# Patient Record
Sex: Male | Born: 1980 | Race: Black or African American | Hispanic: No | Marital: Single | Smoking: Never smoker
Health system: Southern US, Community
[De-identification: ages and names within clinical notes are randomized; demographics above are authoritative.]

## PROBLEM LIST (undated history)

## (undated) HISTORY — PX: FRACTURE SURGERY: SHX138

---

## 2016-04-26 ENCOUNTER — Ambulatory Visit (INDEPENDENT_AMBULATORY_CARE_PROVIDER_SITE_OTHER): Payer: Worker's Compensation

## 2016-04-26 ENCOUNTER — Encounter (INDEPENDENT_AMBULATORY_CARE_PROVIDER_SITE_OTHER): Payer: Self-pay

## 2016-04-26 ENCOUNTER — Ambulatory Visit (INDEPENDENT_AMBULATORY_CARE_PROVIDER_SITE_OTHER): Payer: Worker's Compensation | Admitting: Sports Medicine

## 2016-04-26 ENCOUNTER — Encounter: Payer: Self-pay | Admitting: Sports Medicine

## 2016-04-26 DIAGNOSIS — M2040 Other hammer toe(s) (acquired), unspecified foot: Secondary | ICD-10-CM

## 2016-04-26 DIAGNOSIS — M2141 Flat foot [pes planus] (acquired), right foot: Secondary | ICD-10-CM

## 2016-04-26 DIAGNOSIS — M2142 Flat foot [pes planus] (acquired), left foot: Secondary | ICD-10-CM

## 2016-04-26 DIAGNOSIS — S93602A Unspecified sprain of left foot, initial encounter: Secondary | ICD-10-CM

## 2016-04-26 DIAGNOSIS — M779 Enthesopathy, unspecified: Secondary | ICD-10-CM

## 2016-04-26 DIAGNOSIS — M79672 Pain in left foot: Secondary | ICD-10-CM

## 2016-04-26 DIAGNOSIS — M21619 Bunion of unspecified foot: Secondary | ICD-10-CM

## 2016-04-26 MED ORDER — TRIAMCINOLONE ACETONIDE 40 MG/ML IJ SUSP: 20.0000 mg | Freq: Once | INTRAMUSCULAR | Status: DC

## 2016-04-26 MED ORDER — ACETAMINOPHEN-CODEINE #3 300-30 MG PO TABS: 1.0000 | ORAL_TABLET | Freq: Four times a day (QID) | ORAL | 0 refills | Status: DC | PRN

## 2016-04-26 MED ORDER — METHYLPREDNISOLONE 4 MG PO TBPK: ORAL_TABLET | ORAL | 0 refills | Status: DC

## 2016-04-26 NOTE — Progress Notes (Signed)
Subjective: Allen Davis is a 35 y.o. male patient who presents to office for evaluation of Left big toe joint pain. Patient reports work-related injury 02/14/2016 of which he tripped over a torn safety mat and hit his foot on machine bolt jamming the big toe. Reports that on date of injury, He was permitted to rest and advised to follow up with supervisor if pain persists. Since injury, Patient was treated at urgent care where x-rays were performed and he was diagnosed with turf toe. Patient states that he was given a postoperative shoe, Motrin and other anti-inflammatories and tramadol for pain. States that he still gets swelling across the joint and pain especially when he tries to wear a different shoe other than the postop shoe. States that the pain varies right now pain is 0 out of 10. However, after work can become more bothersome. States that the pain is sharp and radiates on the side of the big toe. 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 Patient denies any other pedal complaints.   There are no active problems to display for this patient.   No current outpatient prescriptions on file prior to visit.   No current facility-administered medications on file prior to visit.     Allergies not on file  Objective:  General: Alert and oriented x3 in no acute distress  Dermatology: No open lesions bilateral lower extremities, no webspace macerations, no ecchymosis bilateral, all nails x 10 are Short and thick but well manicured.  Vascular: Dorsalis Pedis and Posterior Tibial pedal pulses 2/4, Capillary Fill Time 3 seconds, (+) pedal hair growth bilateral, no edema bilateral lower extremities, Temperature gradient within normal limits.  Neurology: Michaell Cowing sensation intact via light touch bilateral. (-) Tinels sign bilateral.   Musculoskeletal: Mild tenderness with palpation at first metatarsal head medial  eminence on left with hallux deviation/deformity, mild guarding without limitation or crepitus with range of motion at left first metatarsophalangeal joint, bunion deformity reducible, tracking not trackbound, Midtarsal, Subtalar joint, and ankle joint range of motion is within normal limits. There is medial arch collapse bilateral suggestive of pes planus with mild lesser hammertoe. Strength within normal limits bilateral.  Xrays  Left Foot    Impression: Normal osseous mineralization, there is mild increased Intermetatarsal angle above normal limits with prominent medial eminence at first metatarsal head and hallux deviation consistent with hallux interphalangeus, lesser hammertoe, pes planus, no fracture or dislocation, soft tissue margins within normal limits. No foreign body.      Assessment and Plan: Problem List Items Addressed This Visit    None    Visit Diagnoses    Left foot pain    -  Primary   Relevant Medications   acetaminophen-codeine (TYLENOL #3) 300-30 MG tablet   methylPREDNISolone (MEDROL DOSEPAK) 4 MG TBPK tablet   triamcinolone acetonide (KENALOG-40) injection 20 mg   Other Relevant Orders   DG Foot 2 Views Left   Capsulitis       Relevant Medications   ibuprofen (ADVIL,MOTRIN) 800 MG tablet   meloxicam (MOBIC) 15 MG tablet   traMADol (ULTRAM) 50 MG tablet   acetaminophen-codeine (TYLENOL #3) 300-30 MG tablet   methylPREDNISolone (MEDROL DOSEPAK) 4 MG TBPK tablet   triamcinolone acetonide (KENALOG-40) injection 20 mg   Bunion       Sprain of foot, left, initial encounter       Relevant Medications   acetaminophen-codeine (TYLENOL #3) 300-30 MG  tablet   methylPREDNISolone (MEDROL DOSEPAK) 4 MG TBPK tablet   Pes planus of both feet       Hammertoe, unspecified laterality          -Complete examination performed -Xrays reviewed -Discussed treatement options For likely preexistent bunion with pain exacerbated by work-related injury and possible sprain of the  first metatarsophalangeal joint, left foot. -After verbal consent, injected into left 1st MTPJ for symptomatic relief 1cc lidocaine plain, 1cc marcaine plain, 0.5cc dexamethasone and Kenalog 40 without complication.   -Recommend icing daily as instructed -Rx Medrol dose pack to take as instructed and Tylenol 3 to take as needed for pain -Recommend continue with postoperative shoe for protection to the big toe joint until symptoms are improved -Recommend continue with light duty with no excessive walking or standing beyond 30 minutes at a time and no heavy lifting over 15 pounds. Failure to comply with this recommendation, May lead to further injury. -Patient to return to office in 3-4 weeks for follow-up evaluation pending approval for follow-up visit from work comp, or sooner if condition worsens.  Asencion Islam, DPM

## 2016-04-30 ENCOUNTER — Telehealth: Payer: Self-pay | Admitting: *Deleted

## 2016-04-30 NOTE — Telephone Encounter (Addendum)
-----   Message from Warner Mccreedyheryl M Bedington sent at 04/30/2016 10:45 AM EDT ----- Regarding: Med Adjustment Contact: [phone] Patient says that the Tylenol #3 that Dr. Marylene LandStover prescribed at his last visit is making him very sleepy at work.  He is asking for an adjustment or another med.

## 2016-04-30 NOTE — Telephone Encounter (Signed)
He can try taking 1/2 the pill if that does not work we can change the medication to tramadol -Dr. Marylene LandStover

## 2016-04-30 NOTE — Telephone Encounter (Signed)
I informed pt of Dr. Wynema BirchStover's orders to try 1/2 the Tylenol #3, and if it didn't work to call again. Pt states understanding.

## 2016-05-24 ENCOUNTER — Ambulatory Visit (INDEPENDENT_AMBULATORY_CARE_PROVIDER_SITE_OTHER): Payer: Worker's Compensation | Admitting: Sports Medicine

## 2016-05-24 ENCOUNTER — Encounter: Payer: Self-pay | Admitting: Sports Medicine

## 2016-05-24 DIAGNOSIS — M21619 Bunion of unspecified foot: Secondary | ICD-10-CM

## 2016-05-24 DIAGNOSIS — M79672 Pain in left foot: Secondary | ICD-10-CM

## 2016-05-24 DIAGNOSIS — M2142 Flat foot [pes planus] (acquired), left foot: Secondary | ICD-10-CM

## 2016-05-24 DIAGNOSIS — S93602D Unspecified sprain of left foot, subsequent encounter: Secondary | ICD-10-CM

## 2016-05-24 DIAGNOSIS — M2040 Other hammer toe(s) (acquired), unspecified foot: Secondary | ICD-10-CM | POA: Diagnosis not present

## 2016-05-24 DIAGNOSIS — M2141 Flat foot [pes planus] (acquired), right foot: Secondary | ICD-10-CM | POA: Diagnosis not present

## 2016-05-24 DIAGNOSIS — M779 Enthesopathy, unspecified: Secondary | ICD-10-CM | POA: Diagnosis not present

## 2016-05-24 NOTE — Progress Notes (Signed)
Subjective: Allen Davis is a 35 y.o. male patient who returns to office for follow up evaluation of Left big toe joint pain. Patient had a work-related injury 02/14/2016 of which he tripped over a torn safety mat and hit his foot on machine bolt jamming the big toe. Reports that injection last visit helped and medrol dose pack and Tylenol #3 helped; has no pain and is tolerating normal shoes. States that the Tylenol #3 made him sleepy so stopped it especially since he felt like he was doing good. Patient denies any other pedal complaints.   There are no active problems to display for this patient.   Current Outpatient Prescriptions on File Prior to Visit  Medication Sig Dispense Refill  . acetaminophen-codeine (TYLENOL #3) 300-30 MG tablet Take 1 tablet by mouth every 6 (six) hours as needed for moderate pain. 30 tablet 0  . ibuprofen (ADVIL,MOTRIN) 800 MG tablet Take 800 mg by mouth every 8 (eight) hours as needed.  0  . meloxicam (MOBIC) 15 MG tablet Take 15 mg by mouth daily. with food  0  . methylPREDNISolone (MEDROL DOSEPAK) 4 MG TBPK tablet Take as instructed 21 tablet 0  . traMADol (ULTRAM) 50 MG tablet TAKE 1 TO 2 TABLETS BY MOUTH AS NEEDED AT BEDTIME  0   Current Facility-Administered Medications on File Prior to Visit  Medication Dose Route Frequency Provider Last Rate Last Dose  . triamcinolone acetonide (KENALOG-40) injection 20 mg  20 mg Other Once Asencion Islamitorya Chandra Asher, DPM        Not on File  Objective:  General: Alert and oriented x3 in no acute distress  Dermatology: No open lesions bilateral lower extremities, no webspace macerations, no ecchymosis bilateral, all nails x 10 are Short and thick but well manicured.  Vascular: Dorsalis Pedis and Posterior Tibial pedal pulses 2/4, Capillary Fill Time 3 seconds, (+) pedal hair growth bilateral, no edema bilateral lower extremities, Temperature gradient within normal limits.  Neurology: Michaell CowingGross sensation intact via light touch  bilateral. (-) Tinels sign bilateral.   Musculoskeletal: No tenderness with palpation at first metatarsal head medial eminence on left, there is hallux deviation/deformity, no guarding without limitation or crepitus with range of motion at left first metatarsophalangeal joint, bunion deformity reducible, tracking not trackbound, Midtarsal, Subtalar joint, and ankle joint range of motion is within normal limits. There is medial arch collapse bilateral suggestive of pes planus with mild lesser hammertoe. Strength within normal limits bilateral.      Assessment and Plan: Problem List Items Addressed This Visit    None    Visit Diagnoses    Sprain of foot, left, subsequent encounter    -  Primary   Capsulitis       Bunion       Hammertoe, unspecified laterality       Pes planus of both feet       Left foot pain          -Complete examination performed -Discussed long term care for bunion with pain exacerbated by work-related injury and possible sprain of the first metatarsophalangeal joint, left foot. -Recommend good supportive shoes and to purchase OTC inserts to prevent recurrence -Recommend icing as needed -Recommend return to full duty with no restrictions -Patient to return to office as needed or sooner if condition worsens.  Asencion Islamitorya Javeria Briski, DPM

## 2017-02-01 ENCOUNTER — Ambulatory Visit: Payer: Worker's Compensation | Admitting: Sports Medicine

## 2017-05-13 ENCOUNTER — Emergency Department (HOSPITAL_COMMUNITY)
Admission: EM | Admit: 2017-05-13 | Discharge: 2017-05-13 | Disposition: A | Discharge status: Home / Self Care | Payer: Self-pay

## 2017-05-13 ENCOUNTER — Encounter (HOSPITAL_COMMUNITY): Payer: Self-pay | Admitting: Emergency Medicine

## 2017-05-13 ENCOUNTER — Ambulatory Visit (HOSPITAL_COMMUNITY)
Admission: EM | Admit: 2017-05-13 | Discharge: 2017-05-13 | Disposition: A | Discharge status: Home / Self Care | Payer: Self-pay | Attending: Emergency Medicine | Admitting: Emergency Medicine

## 2017-05-13 DIAGNOSIS — M79675 Pain in left toe(s): Secondary | ICD-10-CM

## 2017-05-13 MED ORDER — HYDROCODONE-ACETAMINOPHEN 5-325 MG PO TABS: 1.0000 | ORAL_TABLET | Freq: Four times a day (QID) | ORAL | 0 refills | Status: DC | PRN

## 2017-05-13 MED ORDER — PREDNISONE 10 MG (21) PO TBPK: ORAL_TABLET | ORAL | 0 refills | Status: DC

## 2017-05-13 NOTE — ED Triage Notes (Signed)
Pt is having left great toe pain that started yesterday morning.  Pt reports a previous injury to the toe about a year ago.  States he got a cortisone shot that helped it feel better.  Hasn't had any issues until yesterday.

## 2017-05-13 NOTE — ED Provider Notes (Signed)
  [facility]   [id number] 05/13/17 Arrival Time: 1743   SUBJECTIVE:  Allen Davis is a 36 y.o. male who presents to the urgent care  with complaint of left toe pain. Has chronic issues with this foot, was seen by podiatry one year ago and had a steroid injection into the medical tarsal phalangeal joint of the great toe. Over the last 24 hours his pain has worsened. Also has developed redness as well. Denies any history of gout.  ROS: As per HPI, remainder of ROS negative.   OBJECTIVE:  Vitals:   05/13/17 1828  BP: 106/73  Pulse: 74  Temp: 98.6 F (37 C)  TempSrc: Oral  SpO2: 96%     General appearance: alert; no distress HEENT: normocephalic; atraumatic; conjunctivae normal;  Neck: Trachea midline, no JVD Lungs: clear to auscultation bilaterally Heart: regular rate and rhythm Abdomen: soft, non-tender; Musculoskeletal: Notable redness to the base of the great toe, there is tender to touch, capillary refills less than 2 seconds, dorsalis pedis and posterior tibial pulses are 2+ and intact. Skin: warm and dry Neurologic: Grossly normal Psychological:  alert and cooperative; normal mood and affect     ASSESSMENT & PLAN:  1. Pain of toe of left foot     Meds ordered this encounter  Medications  . predniSONE (STERAPRED UNI-PAK 21 TAB) 10 MG (21) TBPK tablet    Sig: Take 6 tablets tomorrow, decrease by 1 each day till finished (6,5,4,3,2,1)    Dispense:  21 tablet    Refill:  0    Order Specific Question:   Supervising Provider    Answer:   Elvina Sidle [[id number]]  . HYDROcodone-acetaminophen (NORCO/VICODIN) 5-325 MG tablet    Sig: Take 1 tablet by mouth every 6 (six) hours as needed.    Dispense:  15 tablet    Refill:  0    Order Specific Question:   Supervising Provider    Answer:   Elvina Sidle [[id number]]   Gout this in the differential based on signs and symptoms, however he states his pain is similar to previous episodes. Has no history of gout.  We'll treat with a short course of hydrocodone and prednisone for inflammation, follow-up with podiatry as needed  [place] controlled substances reporting system consulted prior to issuing prescription, no controlled substances and written in the last year  Reviewed expectations re: course of current medical issues. Questions answered. Outlined signs and symptoms indicating need for more acute intervention. Patient verbalized understanding. After Visit Summary given.    Procedures:     No results found for this or any previous visit.  Labs Reviewed - No data to display  No results found.  No Known Allergies  PMHx, SurgHx, SocialHx, Medications, and Allergies were reviewed in the Visit Navigator and updated as appropriate.       Dorena Bodo, NP 05/13/17 [phone]

## 2017-05-13 NOTE — Discharge Instructions (Signed)
For your toe pain, prescribed a steroid for inflammation called prednisone, take as corrected. I have also prescribed a medicine for pain called hydrocodone, this medicine is a narcotic, it will cause drowsiness, and it is addictive. Do not take more than what is necessary, do not drink alcohol while taking, and do not operate any heavy machinery while taking this medicine. Follow up with podiatry as needed.

## 2017-05-19 ENCOUNTER — Telehealth (HOSPITAL_COMMUNITY): Payer: Self-pay | Admitting: *Deleted

## 2017-05-19 ENCOUNTER — Encounter (HOSPITAL_COMMUNITY): Payer: Self-pay | Admitting: Emergency Medicine

## 2017-05-19 ENCOUNTER — Ambulatory Visit (HOSPITAL_COMMUNITY)
Admission: EM | Admit: 2017-05-19 | Discharge: 2017-05-19 | Disposition: A | Discharge status: Home / Self Care | Payer: Commercial Managed Care - PPO | Attending: Internal Medicine | Admitting: Internal Medicine

## 2017-05-19 DIAGNOSIS — M79675 Pain in left toe(s): Secondary | ICD-10-CM

## 2017-05-19 MED ORDER — PREDNISONE 10 MG (21) PO TBPK: ORAL_TABLET | Freq: Every day | ORAL | 0 refills | Status: DC

## 2017-05-19 MED ORDER — MELOXICAM 7.5 MG PO TABS: 7.5000 mg | ORAL_TABLET | Freq: Every day | ORAL | 0 refills | Status: DC

## 2017-05-19 MED ORDER — MELOXICAM 7.5 MG PO TABS
7.5000 mg | ORAL_TABLET | Freq: Every day | ORAL | 0 refills | Status: DC
Start: 2017-05-19 — End: 2018-06-10

## 2017-05-19 NOTE — Telephone Encounter (Signed)
Pt requesting Rxs sent to [facility] [employer].

## 2017-05-19 NOTE — ED Triage Notes (Signed)
The patient presented to the [facility] with a complaint of left foot pain. The patient was evaluated on 05/13/2017 for the same complaint and referred to ortho but has not contacted them for an appt.

## 2017-05-19 NOTE — Discharge Instructions (Signed)
Start Mobic and prednisone as directed. Ice compress as needed. Follow-up with podiatry for further treatment and management of the left great toe pain.

## 2017-05-19 NOTE — ED Provider Notes (Signed)
[facility]    CSN: [id number] Arrival date & time: 05/19/17  1931     History   Chief Complaint Chief Complaint  Patient presents with  . Foot Pain    HPI Allen Davis is a 36 y.o. male.   36 year old male comes in for exacerbation on chronic left great toe pain. He states this is evaluated a year ago, where he had inflammation to his tendon. He has since had flareups. He was evaluated a week ago for same symptoms, where he was given prednisone pack as well as Vicodin for his pain and inflammation. Patient states he does not feel that prednisone is working, the Vicodin helps with the pain. He states he misplaced his discharge paperwork, and has not been able to contact podiatry for appointment. He states work requires lots of standing and movement, which causes the pain to exacerbate. Denies numbness, tingling, increased swelling, increased redness, increased warmth. Denies fever, chills, night sweats.      History reviewed. No pertinent past medical history.  There are no active problems to display for this patient.   History reviewed. No pertinent surgical history.     Home Medications    Prior to Admission medications   Medication Sig Start Date End Date Taking? Authorizing Provider  acetaminophen-codeine (TYLENOL #3) 300-30 MG tablet Take 1 tablet by mouth every 6 (six) hours as needed for moderate pain. 04/26/16   Asencion Islam, DPM  HYDROcodone-acetaminophen (NORCO/VICODIN) 5-325 MG tablet Take 1 tablet by mouth every 6 (six) hours as needed. 05/13/17   Dorena Bodo, NP  meloxicam (MOBIC) 7.5 MG tablet Take 1 tablet (7.5 mg total) by mouth daily. 05/19/17   Belinda Fisher, PA-C  methylPREDNISolone (MEDROL DOSEPAK) 4 MG TBPK tablet Take as instructed 04/26/16   Asencion Islam, DPM  predniSONE (STERAPRED UNI-PAK 21 TAB) 10 MG (21) TBPK tablet Take by mouth daily. Take 6 tabs by mouth day 1, then 5 tabs, then 4 tabs, then 3 tabs, 2 tabs, then 1 tab for the  last day 05/19/17   Cathie Hoops, [place] V, PA-C  traMADol (ULTRAM) 50 MG tablet TAKE 1 TO 2 TABLETS BY MOUTH AS NEEDED AT BEDTIME 02/24/16   [provider]    Family History History reviewed. No pertinent family history.  Social History Social History  Substance Use Topics  . Smoking status: Never Smoker  . Smokeless tobacco: Never Used  . Alcohol use Not on file     Allergies   Patient has no known allergies.   Review of Systems Review of Systems  Reason unable to perform ROS: See HPI as above.     Physical Exam Triage Vital Signs ED Triage Vitals  Enc Vitals Group     BP 05/19/17 2015 135/83     Pulse Rate 05/19/17 2015 62     Resp 05/19/17 2015 18     Temp 05/19/17 2015 97.9 F (36.6 C)     Temp Source 05/19/17 2015 Oral     SpO2 05/19/17 2015 97 %     Weight --      Height --      Head Circumference --      Peak Flow --      Pain Score 05/19/17 2013 8     Pain Loc --      Pain Edu? --      Excl. in GC? --    No data found.   Updated Vital Signs BP 135/83 (BP Location: Right Arm)  Pulse 62   Temp 97.9 F (36.6 C) (Oral)   Resp 18   SpO2 97%   Visual Acuity Right Eye Distance:   Left Eye Distance:   Bilateral Distance:    Right Eye Near:   Left Eye Near:    Bilateral Near:     Physical Exam  Constitutional: He appears well-developed and well-nourished. No distress.  HENT:  Head: Normocephalic and atraumatic.  Musculoskeletal:  No swelling, erythema, increased warmth noted. Tenderness on palpation of the left great toe at distal metatarsal. Full range of motion of toe. Sensation intact and equal bilaterally. Pedal pulses 2+ and equal bilaterally.     UC Treatments / Results  Labs (all labs ordered are listed, but only abnormal results are displayed) Labs Reviewed - No data to display  EKG  EKG Interpretation None       Radiology No results found.  Procedures Procedures (including critical care time)  Medications Ordered in  UC Medications - No data to display   Initial Impression / Assessment and Plan / UC Course  I have reviewed the triage vital signs and the nursing notes.  Pertinent labs & imaging results that were available during my care of the patient were reviewed by me and considered in my medical decision making (see chart for details).     Meloxicam and prednisone as directed for inflammation and pain. Follow-up with podiatry for further evaluation and treatment. Resources given, return precautions given.  Final Clinical Impressions(s) / UC Diagnoses   Final diagnoses:  Pain of toe of left foot    New Prescriptions New Prescriptions   MELOXICAM (MOBIC) 7.5 MG TABLET    Take 1 tablet (7.5 mg total) by mouth daily.   PREDNISONE (STERAPRED UNI-PAK 21 TAB) 10 MG (21) TBPK TABLET    Take by mouth daily. Take 6 tabs by mouth day 1, then 5 tabs, then 4 tabs, then 3 tabs, 2 tabs, then 1 tab for the last day       Lurline Idol 05/19/17 2118

## 2017-05-23 ENCOUNTER — Telehealth (HOSPITAL_COMMUNITY): Payer: Self-pay | Admitting: Emergency Medicine

## 2017-05-23 NOTE — Telephone Encounter (Signed)
Linward HeadlandAmy Yu, PA called me and clarified that the pt was told that, including Sunday, he was to have a total of three days off and to return to work on August 29, per the work note that was sent home with the pt.  I called the pt back and explained the situation.  Pt stated that he misunderstood and that he would handle it with his job.  Our supervisor does not need to call him back as he states he was satisfied with Amy's answer.

## 2017-05-23 NOTE — Telephone Encounter (Signed)
Pt calling because he thought the provider gave him off of work until Thursday and when he looked at the work note he was sent home with, he saw he was supposed to return yesterday.  Pt is wanting a note to excuse him from work yesterday.

## 2017-06-12 ENCOUNTER — Encounter (HOSPITAL_COMMUNITY): Payer: Self-pay | Admitting: *Deleted

## 2017-06-12 ENCOUNTER — Ambulatory Visit (HOSPITAL_COMMUNITY)
Admission: EM | Admit: 2017-06-12 | Discharge: 2017-06-12 | Disposition: A | Discharge status: Home / Self Care | Payer: Commercial Managed Care - PPO | Attending: Family Medicine | Admitting: Family Medicine

## 2017-06-12 DIAGNOSIS — M109 Gout, unspecified: Secondary | ICD-10-CM | POA: Diagnosis not present

## 2017-06-12 MED ORDER — COLCHICINE 0.6 MG PO TABS: ORAL_TABLET | ORAL | 0 refills | Status: DC

## 2017-06-12 NOTE — ED Triage Notes (Signed)
Pt  Reports  Pain in the  Left  Big  Toe   -    He   Reports      He  Has    Pain     In the   Foot   X   3  Days     He  Denies   Any   specefic  injury   He has   Seen a  Podiatrist   Before  And  Has  Been  To  ucc  Before  For  Similar   Symptom   He  Has  An  appt  wth  Podiatrist  Soon

## 2017-06-15 NOTE — ED Provider Notes (Signed)
  [facility]   [id number] 06/12/17 Arrival Time: 1748  ASSESSMENT & PLAN:  1. Acute gout involving toe of left foot, unspecified cause     Meds ordered this encounter  Medications  . colchicine 0.6 MG tablet    Sig: Take two tablets as one dose followed one hour later by one tablet. May repeat in 24 hours.    Dispense:  6 tablet    Refill:  0   Will treat at gout. F/U with PCP or here in a few days if not improving. Reviewed expectations re: course of current medical issues. Questions answered. Outlined signs and symptoms indicating need for more acute intervention. Patient verbalized understanding. After Visit Summary given.   SUBJECTIVE:  Allen Davis is a 36 y.o. male who presents with complaint of pain at his L great toe. Fairly abrupt onset 3 days ago. No injury. Mild erythema of skin that hasn't changed. Ambulatory. Difficult to wear his normal shoes secondary to discomfort. Afebrile. No sensation changes. OTC analgesics without much relief. Recently given prednisone for similar. "Not much help." Occasional alcohol use. No new medications.  ROS: As per HPI.   OBJECTIVE:  Vitals:   06/12/17 1840  BP: (!) 142/70  Pulse: 72  Resp: 18  Temp: 98.6 F (37 C)  SpO2: 100%    General appearance: alert; no distress Extremities: L great toe with tenderness over MTP joint; mild swelling and erythema here; FROM; normal sensation Skin: warm and dry Psychological: alert and cooperative; normal mood and affect  No Known Allergies   Social History   Social History  . Marital status: Unknown    Spouse name: N/A  . Number of children: N/A  . Years of education: N/A   Occupational History  . Not on file.   Social History Main Topics  . Smoking status: Never Smoker  . Smokeless tobacco: Never Used  . Alcohol use Not on file  . Drug use: Unknown  . Sexual activity: Not on file   Other Topics Concern  . Not on file   Social History Narrative  . No  narrative on file   FH of gout.   Mardella Layman, MD 06/15/17 1149

## 2017-08-14 ENCOUNTER — Ambulatory Visit (HOSPITAL_COMMUNITY)
Admission: EM | Admit: 2017-08-14 | Discharge: 2017-08-14 | Disposition: A | Discharge status: Home / Self Care | Payer: Commercial Managed Care - PPO | Attending: Family Medicine | Admitting: Family Medicine

## 2017-08-14 ENCOUNTER — Encounter (HOSPITAL_COMMUNITY): Payer: Self-pay | Admitting: Emergency Medicine

## 2017-08-14 DIAGNOSIS — R5383 Other fatigue: Secondary | ICD-10-CM

## 2017-08-14 NOTE — Discharge Instructions (Signed)
Rest. Monitor your diet. Regular sleep patterns. Monitor for signs of viral illness. If symptoms worsen or do not improve in the next week to return to be seen or to follow up with PCP as you may need additional work up.

## 2017-08-14 NOTE — ED Triage Notes (Signed)
Pt sts fatigue and decreased appetite x 2 days

## 2017-08-14 NOTE — ED Provider Notes (Signed)
[facility]    CSN: [id number] Arrival date & time: 08/14/17  1711     History   Chief Complaint Chief Complaint  Patient presents with  . Fatigue    HPI Allen Davis is a 36 y.o. male.   Allen SpareSteven presents today with complaints of general fatigue since yesterday. He states he "just doesn't feel 100%." he works two jobs and states he was able to go to both yesterday. Went to first job this morning but feels he is unable to work [employer]. Denies dizziness, headache, blurred vision, weakness, uri symptoms, gi/gu complaints, skin rash, joint pain, any pain, rash. He states he has had two ill coworkers. Denies previous similar. Denies any active bleeding or blood to stool. Answered a phone call during history. Does not take any medications. States he has had poor sleep recently.     ROS per HPI.       History reviewed. No pertinent past medical history.  There are no active problems to display for this patient.   History reviewed. No pertinent surgical history.     Home Medications    Prior to Admission medications   Medication Sig Start Date End Date Taking? Authorizing Provider  acetaminophen-codeine (TYLENOL #3) 300-30 MG tablet Take 1 tablet by mouth every 6 (six) hours as needed for moderate pain. 04/26/16   Asencion IslamStover, Titorya, DPM  colchicine 0.6 MG tablet Take two tablets as one dose followed one hour later by one tablet. May repeat in 24 hours. 06/12/17   Mardella LaymanHagler, Brian, MD  HYDROcodone-acetaminophen (NORCO/VICODIN) 5-325 MG tablet Take 1 tablet by mouth every 6 (six) hours as needed. 05/13/17   Dorena BodoKennard, Lawrence, NP  meloxicam (MOBIC) 7.5 MG tablet Take 1 tablet (7.5 mg total) by mouth daily. 05/19/17   Belinda FisherYu, Amy V, PA-C  methylPREDNISolone (MEDROL DOSEPAK) 4 MG TBPK tablet Take as instructed 04/26/16   Asencion IslamStover, Titorya, DPM  predniSONE (STERAPRED UNI-PAK 21 TAB) 10 MG (21) TBPK tablet Take by mouth daily. Take 6 tabs by mouth day 1, then 5 tabs, then 4 tabs, then  3 tabs, 2 tabs, then 1 tab for the last day 05/19/17   Cathie HoopsYu, Amy V, PA-C  traMADol (ULTRAM) 50 MG tablet TAKE 1 TO 2 TABLETS BY MOUTH AS NEEDED AT BEDTIME 02/24/16   [provider]    Family History History reviewed. No pertinent family history.  Social History Social History   Tobacco Use  . Smoking status: Never Smoker  . Smokeless tobacco: Never Used  Substance Use Topics  . Alcohol use: Not on file  . Drug use: Not on file     Allergies   Patient has no known allergies.   Review of Systems Review of Systems   Physical Exam Triage Vital Signs ED Triage Vitals [08/14/17 1730]  Enc Vitals Group     BP 139/78     Pulse Rate 60     Resp 18     Temp 98.2 F (36.8 C)     Temp Source Oral     SpO2 98 %     Weight      Height      Head Circumference      Peak Flow      Pain Score 0     Pain Loc      Pain Edu?      Excl. in GC?    No data found.  Updated Vital Signs BP 139/78 (BP Location: Left Arm)   Pulse 60  Temp 98.2 F (36.8 C) (Oral)   Resp 18   SpO2 98%   Visual Acuity Right Eye Distance:   Left Eye Distance:   Bilateral Distance:    Right Eye Near:   Left Eye Near:    Bilateral Near:     Physical Exam  Constitutional: He is oriented to person, place, and time. He appears well-developed and well-nourished.  HENT:  Head: Normocephalic and atraumatic.  Right Ear: Tympanic membrane, external ear and ear canal normal.  Left Ear: Tympanic membrane, external ear and ear canal normal.  Nose: Nose normal. Right sinus exhibits no maxillary sinus tenderness and no frontal sinus tenderness. Left sinus exhibits no maxillary sinus tenderness and no frontal sinus tenderness.  Mouth/Throat: Uvula is midline, oropharynx is clear and moist and mucous membranes are normal.  Eyes: Conjunctivae are normal. Pupils are equal, round, and reactive to light.  Neck: Normal range of motion.  Cardiovascular: Normal rate and regular rhythm.  Pulmonary/Chest:  Effort normal and breath sounds normal.  Lymphadenopathy:    He has no cervical adenopathy.  Neurological: He is alert and oriented to person, place, and time.  Skin: Skin is warm and dry.  Vitals reviewed.    UC Treatments / Results  Labs (all labs ordered are listed, but only abnormal results are displayed) Labs Reviewed - No data to display  EKG  EKG Interpretation None       Radiology No results found.  Procedures Procedures (including critical care time)  Medications Ordered in UC Medications - No data to display   Initial Impression / Assessment and Plan / UC Course  I have reviewed the triage vital signs and the nursing notes.  Pertinent labs & imaging results that were available during my care of the patient were reviewed by me and considered in my medical decision making (see chart for details).     Patient without other symptoms at this time, declined chem 8 collection. Discussed that this may be related to general fatigue from working a lot and poor sleep vs pathologic source vs development of illness. Continue to monitor symptoms. He states is will have time off due to the holiday so will be able to rest. If symptoms persist to continue to follow with primary care provider as may need additional work up. Patient verbalized understanding and agreeable to plan.    Final Clinical Impressions(s) / UC Diagnoses   Final diagnoses:  Fatigue, unspecified type    ED Discharge Orders    None       Controlled Substance Prescriptions [place] Controlled Substance Registry consulted? Not Applicable   Georgetta HaberBurky, Mustafa Potts B, NP 08/14/17 [phone]

## 2017-08-19 ENCOUNTER — Encounter (HOSPITAL_COMMUNITY): Payer: Self-pay | Admitting: Emergency Medicine

## 2017-08-19 ENCOUNTER — Ambulatory Visit (HOSPITAL_COMMUNITY)
Admission: EM | Admit: 2017-08-19 | Discharge: 2017-08-19 | Disposition: A | Discharge status: Home / Self Care | Payer: Commercial Managed Care - PPO | Attending: Emergency Medicine | Admitting: Emergency Medicine

## 2017-08-19 DIAGNOSIS — A084 Viral intestinal infection, unspecified: Secondary | ICD-10-CM

## 2017-08-19 MED ORDER — ONDANSETRON HCL 4 MG PO TABS: 4.0000 mg | ORAL_TABLET | Freq: Three times a day (TID) | ORAL | 0 refills | Status: AC | PRN

## 2017-08-19 NOTE — ED Provider Notes (Signed)
[facility]    CSN: [id number] Arrival date & time: 08/19/17  1643     History   Chief Complaint Chief Complaint  Patient presents with  . Influenza    HPI Rolla FlattenSteven Claud is a 36 y.o. male presenting with 3 days of nausea, vomiting and diarrhea. He reports using the bathroom 4-5 times a day and stools are very watery. Denies any blood in his stool and denies dark tarry stools. He has also vomited 2 times on Saturday and once yesterday. No vomiting today. No hematemesis. Vomit is clear and small amount. Has had a decreased appetite and limited oral intake. He has felt fatigued and nauseous over the past week and was seen here last Wednesday. No one else at home has had similar symptoms. He does report eating some [place] food at work that he does not normally eat. 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 No URI symptoms (congestion, cough, sore throat), no abdominal pain, no fever. Does not eat dairy products. No history of IBS or chron's. Does take colchicine for gout, but is not currently taking.   HPI  History reviewed. No pertinent past medical history.  There are no active problems to display for this patient.   Past Surgical History:  Procedure Laterality Date  . FRACTURE SURGERY         Home Medications    Prior to Admission medications   Medication Sig Start Date End Date Taking? Authorizing Provider  acetaminophen-codeine (TYLENOL #3) 300-30 MG tablet Take 1 tablet by mouth every 6 (six) hours as needed for moderate pain. 04/26/16   Asencion IslamStover, Titorya, DPM  colchicine 0.6 MG tablet Take two tablets as one dose followed one hour later by one tablet. May repeat in 24 hours. 06/12/17   Mardella LaymanHagler, Brian, MD  HYDROcodone-acetaminophen (NORCO/VICODIN) 5-325 MG tablet Take 1 tablet by mouth every 6 (six) hours as needed. 05/13/17   Dorena BodoKennard, Lawrence, NP  meloxicam (MOBIC) 7.5 MG tablet Take 1 tablet (7.5 mg total) by mouth daily. 05/19/17   Belinda FisherYu, Amy V, PA-C    methylPREDNISolone (MEDROL DOSEPAK) 4 MG TBPK tablet Take as instructed 04/26/16   Asencion IslamStover, Titorya, DPM  predniSONE (STERAPRED UNI-PAK 21 TAB) 10 MG (21) TBPK tablet Take by mouth daily. Take 6 tabs by mouth day 1, then 5 tabs, then 4 tabs, then 3 tabs, 2 tabs, then 1 tab for the last day 05/19/17   Cathie HoopsYu, Amy V, PA-C  traMADol (ULTRAM) 50 MG tablet TAKE 1 TO 2 TABLETS BY MOUTH AS NEEDED AT BEDTIME 02/24/16   [provider]    Family History No family history on file.  Social History Social History   Tobacco Use  . Smoking status: Never Smoker  . Smokeless tobacco: Never Used  Substance Use Topics  . Alcohol use: No    Frequency: Never  . Drug use: No     Allergies   Patient has no known allergies.   Review of Systems Review of Systems  Constitutional: Positive for appetite change, chills and fatigue. Negative for fever.  HENT: Negative for congestion, rhinorrhea, sinus pressure, sinus pain, sneezing and sore throat.   Respiratory: Negative for cough, chest tightness and shortness of breath.   Cardiovascular: Negative for chest pain.  Gastrointestinal: Positive for diarrhea, nausea and vomiting. Negative for abdominal pain, anal bleeding and blood in stool.  Skin: Negative for rash.  Neurological: Positive for light-headedness. Negative for dizziness and headaches.     Physical Exam Triage Vital Signs ED  Triage Vitals [08/19/17 1704]  Enc Vitals Group     BP 138/79     Pulse Rate 72     Resp 16     Temp (!) 97.4 F (36.3 C)     Temp Source Oral     SpO2 98 %     Weight (!) 340 lb (154.2 kg)     Height 6\' 2"  (1.88 m)     Head Circumference      Peak Flow      Pain Score 5     Pain Loc      Pain Edu?      Excl. in GC?    No data found.  Updated Vital Signs BP 138/79   Pulse 72   Temp (!) 97.4 F (36.3 C) (Oral)   Resp 16   Ht 6\' 2"  (1.88 m)   Wt (!) 340 lb (154.2 kg)   SpO2 98%   BMI 43.65 kg/m   Physical Exam  Constitutional: He is oriented  to person, place, and time. He appears well-developed and well-nourished.  Overweight male in no acute distress  HENT:  Head: Normocephalic and atraumatic.  Nose: Nose normal. No epistaxis.  Mouth/Throat: Oropharynx is clear and moist.  Eyes: EOM are normal. Pupils are equal, round, and reactive to light.  Neck: Normal range of motion. Neck supple.  Cardiovascular: Normal rate and regular rhythm.  No murmur heard. Pulmonary/Chest: Effort normal and breath sounds normal. No respiratory distress.  Abdominal: Soft. He exhibits no distension. There is no tenderness. There is no rebound and no guarding.  Negative Murphy's, Negative McBurnies  Lymphadenopathy:    He has no cervical adenopathy.  Neurological: He is alert and oriented to person, place, and time.  Skin: Skin is warm and dry.  Good turgor     UC Treatments / Results  Labs (all labs ordered are listed, but only abnormal results are displayed) Labs Reviewed - No data to display  EKG  EKG Interpretation None       Radiology No results found.  Procedures Procedures (including critical care time)  Medications Ordered in UC Medications - No data to display   Initial Impression / Assessment and Plan / UC Course  I have reviewed the triage vital signs and the nursing notes.  Pertinent labs & imaging results that were available during my care of the patient were reviewed by me and considered in my medical decision making (see chart for details).    Likely a viral gastroenteritis, educated that it is probably self- limiting. Gave zofran for nausea and vomiting. Explained that we do not want to slow down the diarrhea as it is the body's way of expelling and infectious cause. Advised he may try imodium to reduce number of diaarhea/day, but he does not want to stop it. Reinforced importance of hydration and fluids- may try Pedialyte. Take in food as tolerated- begin with smaller meals and bland food. As he tolerates may  transition to his regular food.  Return if symptoms do not improve in 1 week. Return if notices blood in stool, dark tarry stools, or significant dehydration.   Final Clinical Impressions(s) / UC Diagnoses   Final diagnoses:  Viral gastroenteritis    ED Discharge Orders    None       Controlled Substance Prescriptions [place] Controlled Substance Registry consulted? Not Applicable   Lew DawesWieters, Earline Stiner C, [place]PA-C 08/19/17 1819

## 2017-08-19 NOTE — ED Triage Notes (Signed)
Pt was seen last Wednesday for fatigue. PT reports he developed diarrhea 3 days ago, nausea and vomiting 2 days ago and has decreased appetite, and nose bleeds as well.

## 2017-08-19 NOTE — Discharge Instructions (Signed)
Your diarrhea and vomiting are likely due to a viral gastroenteritis. I expect you symptoms to improve over the next week.  Try to stay hydrated with plenty of fluids, may drink sport drinks or Pedialyte for rehydration. Try to eat as best you can start off with blander food like soup/broth, toast, applesauce. Transition to your normal food as you are able to tolerate without vomiting. Use good hand hygiene!  If you are still experiencing diarrhea and vomiting in one week please return. Also return if you experience blood in the stool.

## 2017-09-06 ENCOUNTER — Encounter (HOSPITAL_COMMUNITY): Payer: Self-pay | Admitting: Emergency Medicine

## 2017-09-06 ENCOUNTER — Ambulatory Visit (HOSPITAL_COMMUNITY)
Admission: EM | Admit: 2017-09-06 | Discharge: 2017-09-06 | Disposition: A | Discharge status: Home / Self Care | Payer: Commercial Managed Care - PPO | Attending: Internal Medicine | Admitting: Internal Medicine

## 2017-09-06 ENCOUNTER — Other Ambulatory Visit: Payer: Self-pay

## 2017-09-06 DIAGNOSIS — M10072 Idiopathic gout, left ankle and foot: Secondary | ICD-10-CM | POA: Diagnosis not present

## 2017-09-06 MED ORDER — PREDNISONE 10 MG (21) PO TBPK: ORAL_TABLET | Freq: Every day | ORAL | 0 refills | Status: DC

## 2017-09-06 MED ORDER — COLCHICINE 0.6 MG PO TABS: ORAL_TABLET | ORAL | 2 refills | Status: DC

## 2017-09-06 NOTE — Discharge Instructions (Signed)
I have provided you with Colchicine to take as directed on the bottle. I also gave you a prednisone pack if the colchicine does not do the trick. Use them if needed. Again these will not cure you, but will help the flare. Call and schedule with me and we will get you taken care of. Feel better. Watch your beer, shellfish, regular soft drinks and livers. All of these foods if apply to you will cause big flares.

## 2017-09-06 NOTE — ED Provider Notes (Signed)
[facility]    CSN: [id number] Arrival date & time: 09/06/17  1535     History   Chief Complaint Chief Complaint  Patient presents with  . Gout    HPI Rolla FlattenSteven Fina is a 36 y.o. male.    Presents with a gout flare to his left great toe. This is his 3rd flare. He responds to Colchicine and prednisone (at times). He has never been on urate lowering therapy. Pain, redness and warmth is described. No fever or chills.       History reviewed. No pertinent past medical history.  There are no active problems to display for this patient.   Past Surgical History:  Procedure Laterality Date  . FRACTURE SURGERY         Home Medications    Prior to Admission medications   Medication Sig Start Date End Date Taking? Authorizing Provider  acetaminophen-codeine (TYLENOL #3) 300-30 MG tablet Take 1 tablet by mouth every 6 (six) hours as needed for moderate pain. 04/26/16   Asencion IslamStover, Titorya, DPM  colchicine 0.6 MG tablet Take two tablets as one dose followed one hour later by one tablet. May repeat in 24 hours. 06/12/17   Mardella LaymanHagler, Brian, MD  HYDROcodone-acetaminophen (NORCO/VICODIN) 5-325 MG tablet Take 1 tablet by mouth every 6 (six) hours as needed. 05/13/17   Dorena BodoKennard, Lawrence, NP  meloxicam (MOBIC) 7.5 MG tablet Take 1 tablet (7.5 mg total) by mouth daily. 05/19/17   Belinda FisherYu, Amy V, PA-C  methylPREDNISolone (MEDROL DOSEPAK) 4 MG TBPK tablet Take as instructed 04/26/16   Asencion IslamStover, Titorya, DPM  predniSONE (STERAPRED UNI-PAK 21 TAB) 10 MG (21) TBPK tablet Take by mouth daily. Take 6 tabs by mouth day 1, then 5 tabs, then 4 tabs, then 3 tabs, 2 tabs, then 1 tab for the last day 05/19/17   Cathie HoopsYu, Amy V, PA-C  traMADol (ULTRAM) 50 MG tablet TAKE 1 TO 2 TABLETS BY MOUTH AS NEEDED AT BEDTIME 02/24/16   [provider]    Family History History reviewed. No pertinent family history.  Social History Social History   Tobacco Use  . Smoking status: Never Smoker  . Smokeless  tobacco: Never Used  Substance Use Topics  . Alcohol use: No    Frequency: Never  . Drug use: No     Allergies   Patient has no known allergies.   Review of Systems Review of Systems  All other systems reviewed and are negative.    Physical Exam Triage Vital Signs ED Triage Vitals  Enc Vitals Group     BP 09/06/17 1608 123/79     Pulse Rate 09/06/17 1608 65     Resp --      Temp 09/06/17 1608 (!) 97.2 F (36.2 C)     Temp Source 09/06/17 1608 Oral     SpO2 09/06/17 1608 95 %     Weight --      Height --      Head Circumference --      Peak Flow --      Pain Score 09/06/17 1609 8     Pain Loc --      Pain Edu? --      Excl. in GC? --    No data found.  Updated Vital Signs BP 123/79 (BP Location: Left Wrist)   Pulse 65   Temp (!) 97.2 F (36.2 C) (Oral)   SpO2 95%   Visual Acuity Right Eye Distance:   Left Eye Distance:  Bilateral Distance:    Right Eye Near:   Left Eye Near:    Bilateral Near:     Physical Exam  Constitutional: He appears well-developed and well-nourished. No distress.  Musculoskeletal: He exhibits edema and tenderness. He exhibits no deformity.  Left 1st MTP with pain to palpation, erythema, warmth and mild edema laterally  Neurological: He is alert.  Skin: Skin is warm and dry. No rash noted. He is not diaphoretic.  Psychiatric: His behavior is normal.  Nursing note and vitals reviewed.    UC Treatments / Results  Labs (all labs ordered are listed, but only abnormal results are displayed) Labs Reviewed - No data to display  EKG  EKG Interpretation None       Radiology No results found.  Procedures Procedures (including critical care time)  Medications Ordered in UC Medications - No data to display   Initial Impression / Assessment and Plan / UC Course  I have reviewed the triage vital signs and the nursing notes.  Pertinent labs & imaging results that were available during my care of the patient were  reviewed by me and considered in my medical decision making (see chart for details).     Will treat with Colchicine and a back up prednisone pack if needed. FU with Rheumatology for gout management.   Final Clinical Impressions(s) / UC Diagnoses   Final diagnoses:  None    ED Discharge Orders    None       Controlled Substance Prescriptions [place] Controlled Substance Registry consulted? Not Applicable   Riki SheerYoung, Michelle G, PA-C 09/06/17 1725

## 2017-09-06 NOTE — ED Triage Notes (Signed)
Pt reports having a gout flare up in his left foot.  He states the last time he was here the medicine we gave him worked very well.

## 2018-03-20 ENCOUNTER — Encounter (HOSPITAL_COMMUNITY): Payer: Self-pay | Admitting: Emergency Medicine

## 2018-03-20 ENCOUNTER — Ambulatory Visit (HOSPITAL_COMMUNITY)
Admission: EM | Admit: 2018-03-20 | Discharge: 2018-03-20 | Disposition: A | Discharge status: Home / Self Care | Payer: Commercial Managed Care - PPO | Attending: Family Medicine | Admitting: Family Medicine

## 2018-03-20 DIAGNOSIS — R220 Localized swelling, mass and lump, head: Secondary | ICD-10-CM

## 2018-03-20 DIAGNOSIS — T63441A Toxic effect of venom of bees, accidental (unintentional), initial encounter: Secondary | ICD-10-CM

## 2018-03-20 NOTE — Discharge Instructions (Addendum)
Start taking Benadryl tonight. °

## 2018-03-20 NOTE — ED Triage Notes (Signed)
Pt stung by a bee on the lip and upper arm.

## 2018-04-02 NOTE — ED Provider Notes (Signed)
  [facility]   [id number] 03/20/18 Arrival Time: 1936  ASSESSMENT & PLAN:  1. Bee sting, accidental or unintentional, initial encounter    Benadryl if needed. Will follow up with PCP or here if worsening or failing to improve as anticipated. Reviewed expectations re: course of current medical issues. Questions answered. Outlined signs and symptoms indicating need for more acute intervention. Patient verbalized understanding. After Visit Summary given.   SUBJECTIVE:  Allen Davis is a 37 y.o. male who presents with a skin complaint.   Location: R lower lip swelling after getting stung here by a bee Onset: abrupt Duration: a few hours Pruritic? No Painful? mildly Progression: stable  No swallowing or respiratory difficulties reported. No OTC/home treatment. No n/v.  ROS: As per HPI.  OBJECTIVE: Vitals:   03/20/18 1948  BP: 135/80  Pulse: 81  Resp: 18  Temp: 98.6 F (37 C)  SpO2: 99%    General appearance: alert; no distress Lungs: clear to auscultation bilaterally Heart: regular rate and rhythm Extremities: no edema Skin: warm and dry; R lower lip with mild swelling; no induration; no stinger found Psychological: alert and cooperative; normal mood and affect  No Known Allergies  History reviewed. No pertinent past medical history. Social History   Socioeconomic History  . Marital status: Unknown    Spouse name: Not on file  . Number of children: Not on file  . Years of education: Not on file  . Highest education level: Not on file  Occupational History  . Not on file  Social Needs  . Financial resource strain: Not on file  . Food insecurity:    Worry: Not on file    Inability: Not on file  . Transportation needs:    Medical: Not on file    Non-medical: Not on file  Tobacco Use  . Smoking status: Never Smoker  . Smokeless tobacco: Never Used  Substance and Sexual Activity  . Alcohol use: No    Frequency: Never  . Drug use: No  .  Sexual activity: Not on file  Lifestyle  . Physical activity:    Days per week: Not on file    Minutes per session: Not on file  . Stress: Not on file  Relationships  . Social connections:    Talks on phone: Not on file    Gets together: Not on file    Attends religious service: Not on file    Active member of club or organization: Not on file    Attends meetings of clubs or organizations: Not on file    Relationship status: Not on file  . Intimate partner violence:    Fear of current or ex partner: Not on file    Emotionally abused: Not on file    Physically abused: Not on file    Forced sexual activity: Not on file  Other Topics Concern  . Not on file  Social History Narrative  . Not on file   No family history on file. Past Surgical History:  Procedure Laterality Date  . FRACTURE SURGERY       Mardella LaymanHagler, Argelio Granier, MD 04/02/18 1026

## 2018-06-10 ENCOUNTER — Other Ambulatory Visit: Payer: Self-pay

## 2018-06-10 ENCOUNTER — Ambulatory Visit (HOSPITAL_COMMUNITY)
Admission: EM | Admit: 2018-06-10 | Discharge: 2018-06-10 | Disposition: A | Discharge status: Home / Self Care | Payer: Self-pay | Attending: Family Medicine | Admitting: Family Medicine

## 2018-06-10 ENCOUNTER — Encounter (HOSPITAL_COMMUNITY): Payer: Self-pay | Admitting: Emergency Medicine

## 2018-06-10 DIAGNOSIS — J020 Streptococcal pharyngitis: Secondary | ICD-10-CM

## 2018-06-10 LAB — POCT RAPID STREP A: Streptococcus, Group A Screen (Direct): POSITIVE — AB

## 2018-06-10 MED ORDER — PENICILLIN G BENZATHINE 1200000 UNIT/2ML IM SUSP
1.2000 10*6.[IU] | Freq: Once | INTRAMUSCULAR | Status: AC
  Administered 2018-06-10: 1.2 10*6.[IU] via INTRAMUSCULAR

## 2018-06-10 MED ORDER — PENICILLIN G BENZATHINE 1200000 UNIT/2ML IM SUSP
INTRAMUSCULAR | Status: AC
  Filled 2018-06-10: qty 2

## 2018-06-10 NOTE — ED Provider Notes (Signed)
[facility]    CSN: [id number] Arrival date & time: 06/10/18  0915     History   Chief Complaint Chief Complaint  Patient presents with  . Sore Throat    HPI Allen Davis is a 37 y.o. male.   37 year old male comes in for 2-day history of sore throat.  Denies rhinorrhea, nasal congestion, cough.  Denies fever but has had chills.  Painful swallowing without trouble breathing, tripoding, drooling, trismus.  No obvious sick contact.  Never smoker.     History reviewed. No pertinent past medical history.  There are no active problems to display for this patient.   Past Surgical History:  Procedure Laterality Date  . FRACTURE SURGERY         Home Medications    Prior to Admission medications   Not on File    Family History History reviewed. No pertinent family history.  Social History Social History   Tobacco Use  . Smoking status: Never Smoker  . Smokeless tobacco: Never Used  Substance Use Topics  . Alcohol use: No    Frequency: Never  . Drug use: No     Allergies   Patient has no known allergies.   Review of Systems Review of Systems  Reason unable to perform ROS: See HPI as above.     Physical Exam Triage Vital Signs ED Triage Vitals  Enc Vitals Group     BP 06/10/18 0949 131/75     Pulse Rate 06/10/18 0949 80     Resp 06/10/18 0949 20     Temp 06/10/18 0949 99.1 F (37.3 C)     Temp Source 06/10/18 0949 Oral     SpO2 06/10/18 0949 98 %     Weight --      Height --      Head Circumference --      Peak Flow --      Pain Score 06/10/18 1009 7     Pain Loc --      Pain Edu? --      Excl. in GC? --    No data found.  Updated Vital Signs BP 131/75 (BP Location: Right Arm)   Pulse 80   Temp 99.1 F (37.3 C) (Oral)   Resp 20   SpO2 98%   Physical Exam  Constitutional: He is oriented to person, place, and time. He appears well-developed and well-nourished. No distress.  HENT:  Head: Normocephalic and  atraumatic.  Right Ear: Tympanic membrane, external ear and ear canal normal. Tympanic membrane is not erythematous and not bulging.  Left Ear: Tympanic membrane, external ear and ear canal normal. Tympanic membrane is not erythematous and not bulging.  Nose: Nose normal. Right sinus exhibits no maxillary sinus tenderness and no frontal sinus tenderness. Left sinus exhibits no maxillary sinus tenderness and no frontal sinus tenderness.  Mouth/Throat: Uvula is midline, oropharynx is clear and moist and mucous membranes are normal. Tonsils are 3+ on the right. Tonsils are 3+ on the left. Tonsillar exudate.  Eyes: Pupils are equal, round, and reactive to light. Conjunctivae are normal.  Neck: Normal range of motion. Neck supple.  Cardiovascular: Normal rate, regular rhythm and normal heart sounds. Exam reveals no gallop and no friction rub.  No murmur heard. Pulmonary/Chest: Effort normal and breath sounds normal. He has no decreased breath sounds. He has no wheezes. He has no rhonchi. He has no rales.  Lymphadenopathy:    He has no cervical adenopathy.  Neurological: He is  alert and oriented to person, place, and time.  Skin: Skin is warm and dry.  Psychiatric: He has a normal mood and affect. His behavior is normal. Judgment normal.     UC Treatments / Results  Labs (all labs ordered are listed, but only abnormal results are displayed) Labs Reviewed  POCT RAPID STREP A - Abnormal; Notable for the following components:      Result Value   Streptococcus, Group A Screen (Direct) POSITIVE (*)    All other components within normal limits    EKG None  Radiology No results found.  Procedures Procedures (including critical care time)  Medications Ordered in UC Medications  penicillin g benzathine (BICILLIN LA) 1200000 UNIT/2ML injection 1.2 Million Units (1.2 Million Units Intramuscular Given 06/10/18 1114)    Initial Impression / Assessment and Plan / UC Course  I have reviewed the  triage vital signs and the nursing notes.  Pertinent labs & imaging results that were available during my care of the patient were reviewed by me and considered in my medical decision making (see chart for details).    Rapid strep positive.  Bicillin injection in office today.  Other symptomatic treatment discussed.  Return precautions given.  Final Clinical Impressions(s) / UC Diagnoses   Final diagnoses:  Strep pharyngitis    ED Prescriptions    None        Belinda Fisher, PA-C 06/10/18 1732

## 2018-06-10 NOTE — Discharge Instructions (Signed)
Rapid strep positive. Antibiotic injection in office today. Tylenol/Motrin for fever and pain. Monitor for any worsening of symptoms, trouble breathing, trouble swallowing, swelling of the throat, leaning forward to breath, drooling, follow up at the emergency department for reevaluation.

## 2018-06-10 NOTE — ED Triage Notes (Signed)
The patient presented to the [facility] with a complaint of a sore throat x 2 days. 

## 2018-06-18 ENCOUNTER — Emergency Department (HOSPITAL_COMMUNITY)
Admission: EM | Admit: 2018-06-18 | Discharge: 2018-06-18 | Disposition: A | Discharge status: Home / Self Care | Payer: Self-pay | Attending: Emergency Medicine | Admitting: Emergency Medicine

## 2018-06-18 ENCOUNTER — Emergency Department (HOSPITAL_COMMUNITY): Payer: Self-pay

## 2018-06-18 ENCOUNTER — Encounter (HOSPITAL_COMMUNITY): Payer: Self-pay | Admitting: Emergency Medicine

## 2018-06-18 DIAGNOSIS — M79672 Pain in left foot: Secondary | ICD-10-CM | POA: Insufficient documentation

## 2018-06-18 DIAGNOSIS — M109 Gout, unspecified: Secondary | ICD-10-CM | POA: Insufficient documentation

## 2018-06-18 MED ORDER — KETOROLAC TROMETHAMINE 30 MG/ML IJ SOLN
30.0000 mg | Freq: Once | INTRAMUSCULAR | Status: AC
  Administered 2018-06-18: 30 mg via INTRAMUSCULAR
  Filled 2018-06-18: qty 1

## 2018-06-18 MED ORDER — COLCHICINE 0.6 MG PO TABS: 0.6000 mg | ORAL_TABLET | Freq: Every day | ORAL | 0 refills | Status: DC

## 2018-06-18 MED ORDER — INDOMETHACIN 25 MG PO CAPS: 25.0000 mg | ORAL_CAPSULE | Freq: Three times a day (TID) | ORAL | 0 refills | Status: DC | PRN

## 2018-06-18 NOTE — ED Provider Notes (Signed)
[facility]-EMERGENCY DEPT Provider Note   CSN: [id number] Arrival date & time: 06/18/18  [id number]     History   Chief Complaint Chief Complaint  Patient presents with  . Foot Pain    HPI Allen Davis is a 37 y.o. male.  HPI  Presents with concern for left foot pain> Pain present at base of great toe. Similar symptoms in the past diagnosed as gout and treated with colchicine with relief.  Pain has been present for 2 days, severe 10/10. No hx of trauma.   History reviewed. No pertinent past medical history.  There are no active problems to display for this patient.   Past Surgical History:  Procedure Laterality Date  . FRACTURE SURGERY          Home Medications    Prior to Admission medications   Medication Sig Start Date End Date Taking? Authorizing Provider  colchicine 0.6 MG tablet Take 1 tablet (0.6 mg total) by mouth daily. Take 1.2mg  at the first sign of a flare followed by .6mg  one hour later.  Then continue .6mg  once daily as prophylaxis. 06/18/18   Alvira MondaySchlossman, Refael Fulop, MD  indomethacin (INDOCIN) 25 MG capsule Take 1 capsule (25 mg total) by mouth 3 (three) times daily as needed. 06/18/18   Alvira MondaySchlossman, Kamori Barbier, MD    Family History No family history on file.  Social History Social History   Tobacco Use  . Smoking status: Never Smoker  . Smokeless tobacco: Never Used  Substance Use Topics  . Alcohol use: No    Frequency: Never  . Drug use: No     Allergies   Patient has no known allergies.   Review of Systems Review of Systems  Constitutional: Negative for fever.  Musculoskeletal: Positive for arthralgias and gait problem.     Physical Exam Updated Vital Signs BP 131/82   Pulse 65   Temp 98.8 F (37.1 C) (Oral)   Resp 17   Ht 6\' 2"  (1.88 m)   Wt (!) 158.8 kg   SpO2 100%   BMI 44.94 kg/m   Physical Exam  Constitutional: He is oriented to person, place, and time. He appears well-developed and well-nourished. No  distress.  HENT:  Head: Normocephalic and atraumatic.  Eyes: Conjunctivae and EOM are normal.  Neck: Normal range of motion.  Cardiovascular: Normal rate, regular rhythm and intact distal pulses.  Pulmonary/Chest: Effort normal. No respiratory distress.  Musculoskeletal: He exhibits tenderness (left mtp). He exhibits no edema.  Neurological: He is alert and oriented to person, place, and time.  Skin: Skin is warm and dry. He is not diaphoretic.  Nursing note and vitals reviewed.    ED Treatments / Results  Labs (all labs ordered are listed, but only abnormal results are displayed) Labs Reviewed - No data to display  EKG None  Radiology Dg Foot Complete Left  Result Date: 06/18/2018 CLINICAL DATA:  Soft tissue swelling, most notably in the first toe region EXAM: LEFT FOOT - COMPLETE 3+ VIEW COMPARISON:  None. FINDINGS: Frontal, oblique, and lateral views were obtained. There is soft tissue swelling throughout the midfoot and forefoot regions. There is no fracture or dislocation. Joint spaces appear unremarkable. No erosive change. No bony destruction. No radiopaque foreign body. IMPRESSION: Somewhat generalized soft tissue swelling. No erosive change or bony destruction. No appreciable joint space narrowing. No fracture or dislocation. Electronically Signed   By: Bretta BangWilliam  Woodruff III M.D.   On: 06/18/2018 10:21    Procedures Procedures (including critical  care time)  Medications Ordered in ED Medications  ketorolac (TORADOL) 30 MG/ML injection 30 mg (30 mg Intramuscular Given 06/18/18 1159)     Initial Impression / Assessment and Plan / ED Course  I have reviewed the triage vital signs and the nursing notes.  Pertinent labs & imaging results that were available during my care of the patient were reviewed by me and considered in my medical decision making (see chart for details).     37yo male presents with left MTP pain. XR without acute abnormalities.  Suspect gout by  history and physical exam. Normal GFR 15yr ago. Given indomethacin, colchicine, recommend tylenol.  Patient discharged in stable condition with understanding of reasons to return.   Final Clinical Impressions(s) / ED Diagnoses   Final diagnoses:  Left foot pain  Acute gout involving toe of left foot, unspecified cause    ED Discharge Orders         Ordered    indomethacin (INDOCIN) 25 MG capsule  3 times daily PRN     06/18/18 1206    colchicine 0.6 MG tablet  Daily     06/18/18 1206           Alvira Monday, MD 06/18/18 2314

## 2018-06-18 NOTE — ED Triage Notes (Signed)
Patient here from home with complaints of left foot pain, increased in great toe. Reports injury 2 years ago, seen at urgent care for same, suspected possible gout. Pain 10/10.

## 2019-08-31 IMAGING — CR DG FOOT COMPLETE 3+V*L*
3 series · 3 of 3 positions shown · non-contrast
Comparison: None.

CLINICAL DATA: Soft tissue swelling, most notably in the first toe
region

EXAM:
LEFT FOOT - COMPLETE 3+ VIEW

[x foot ap left]
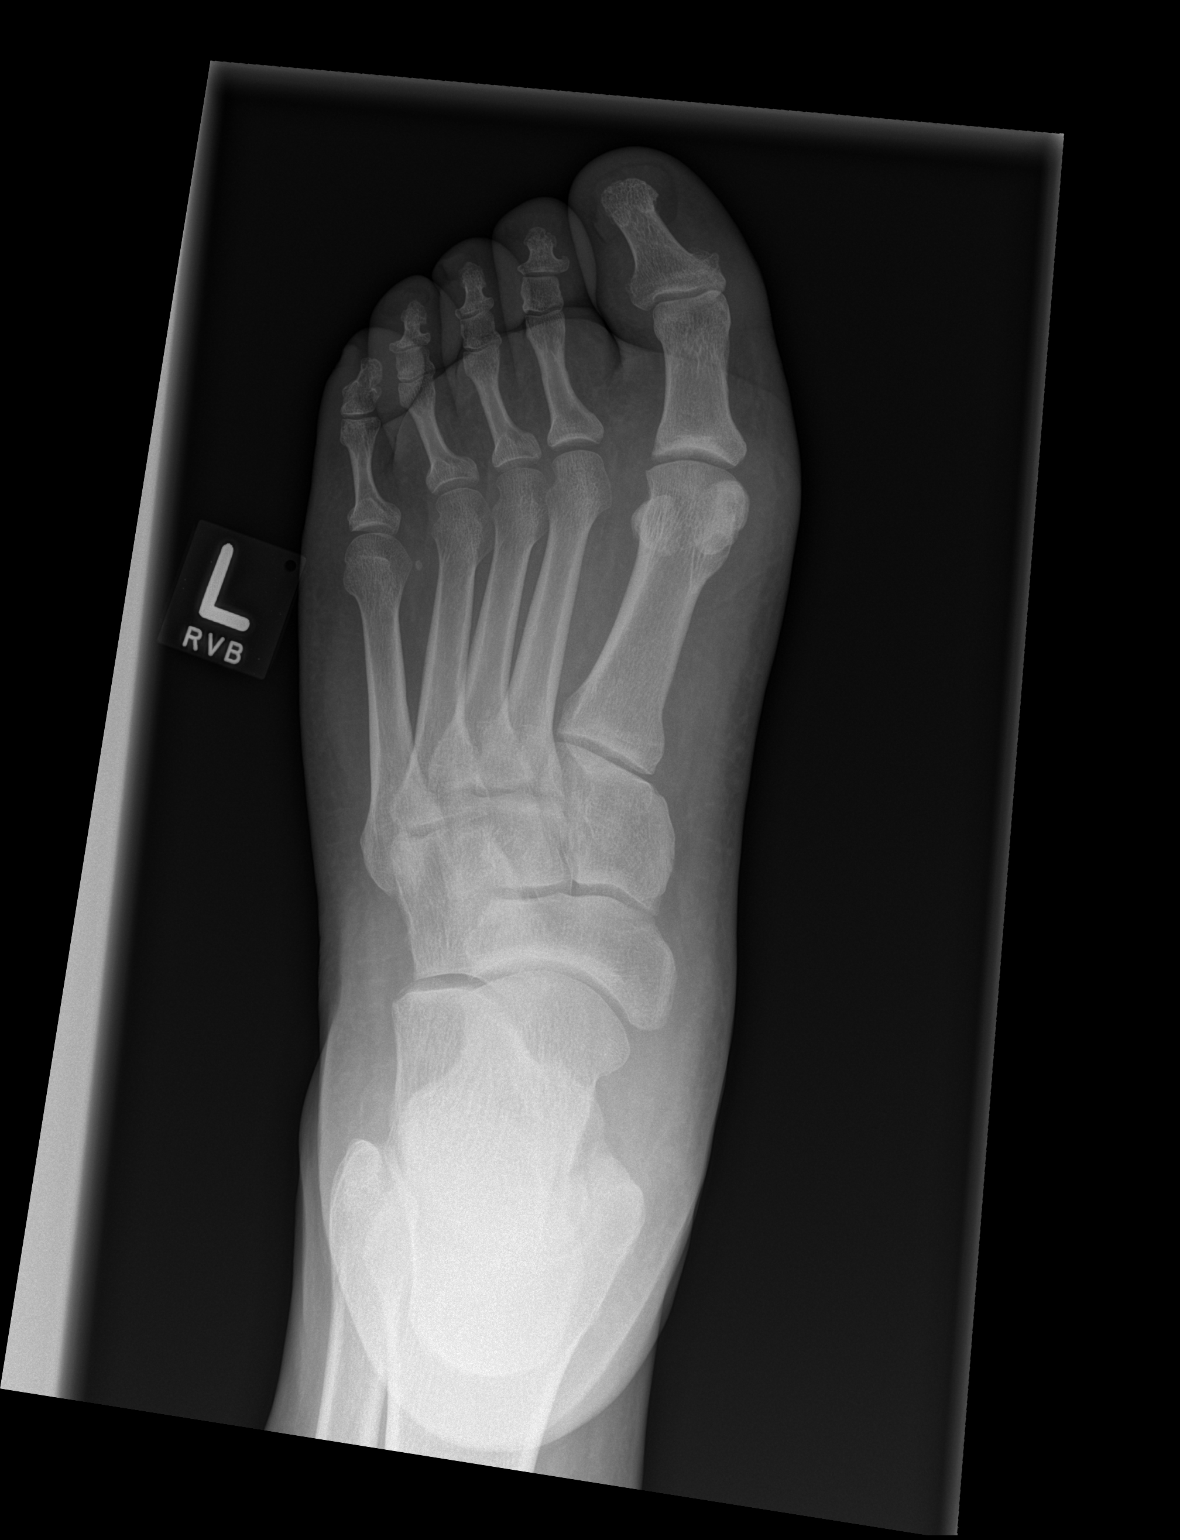

[x foot obl left]
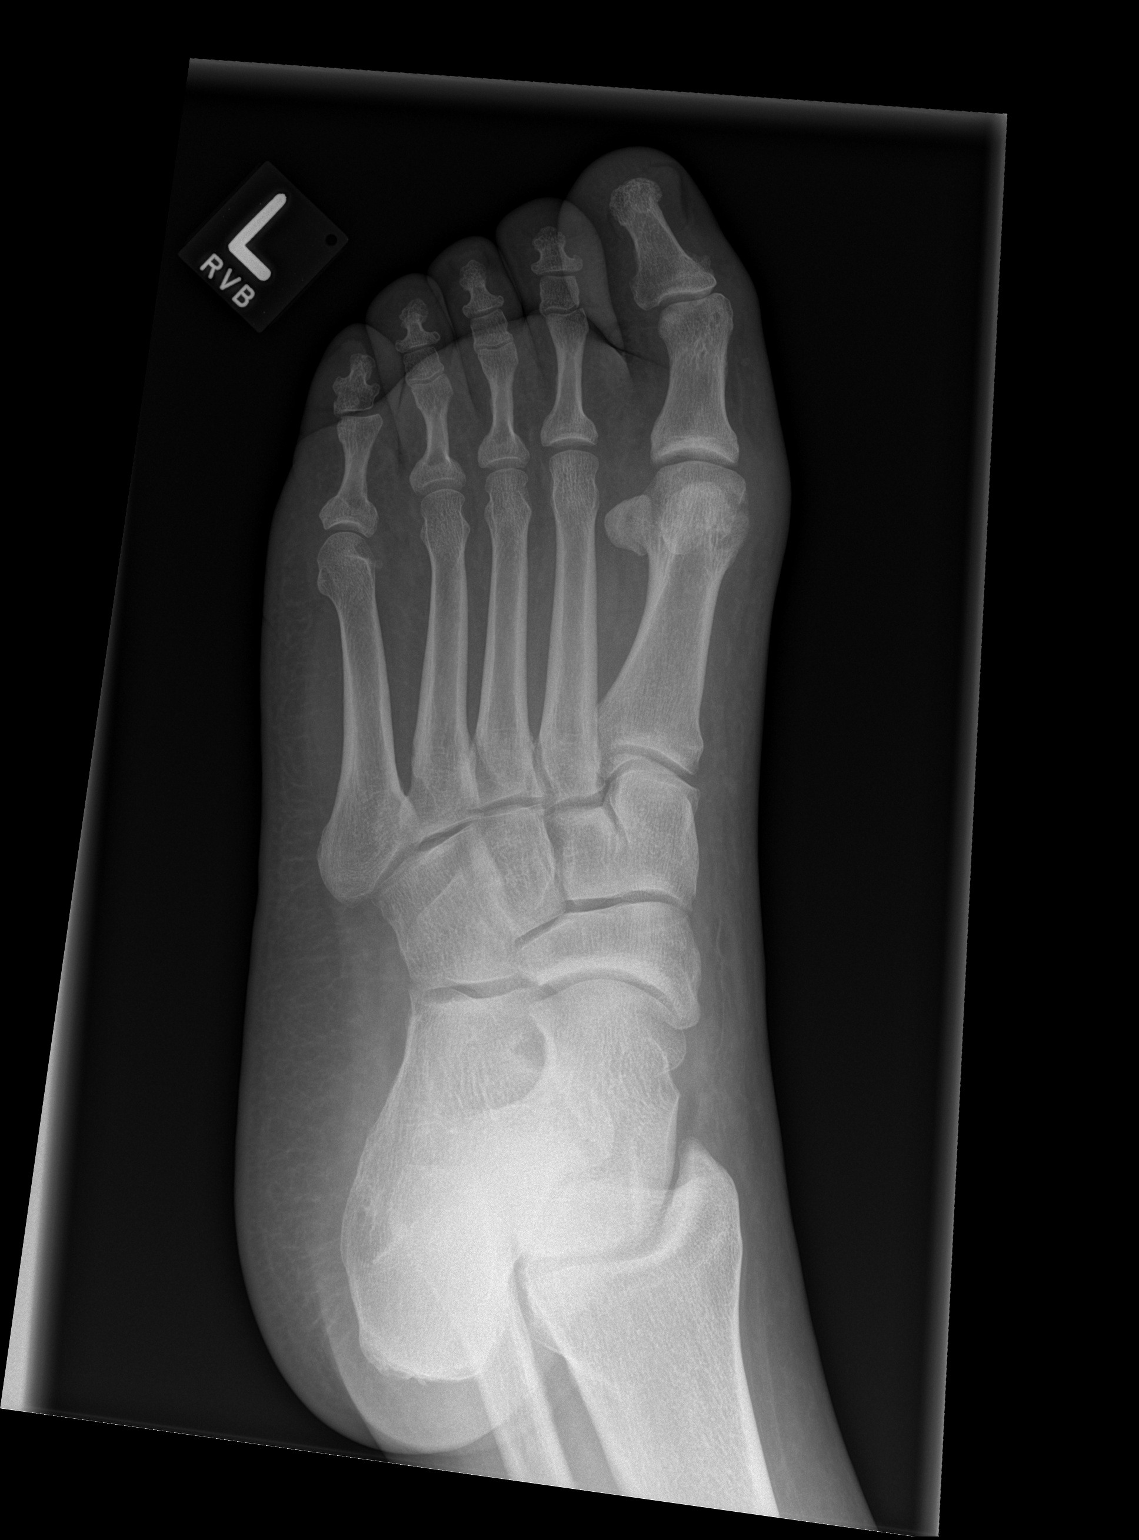

[x foot lat left]
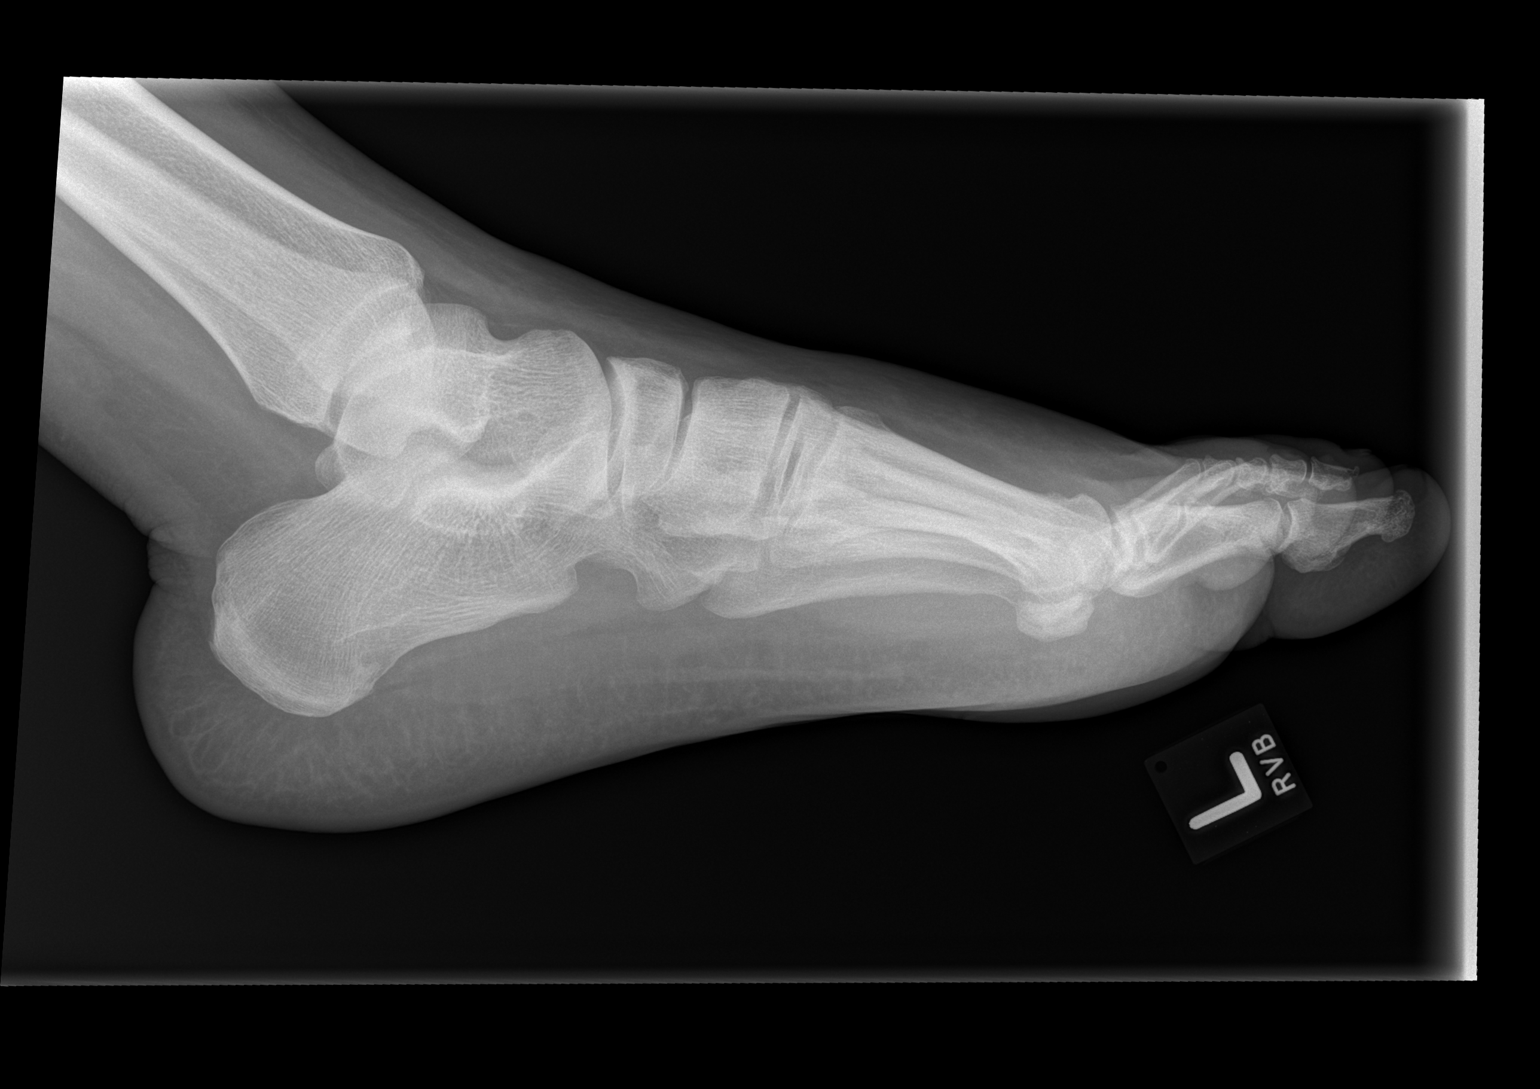

[3 of 3 positions shown; findings below may reference images not displayed]

FINDINGS: Frontal, oblique, and lateral views were obtained. There is soft
tissue swelling throughout the midfoot and forefoot regions. There
is no fracture or dislocation. Joint spaces appear unremarkable. No
erosive change. No bony destruction. No radiopaque foreign body.
IMPRESSION: Somewhat generalized soft tissue swelling. No erosive change or bony
destruction. No appreciable joint space narrowing. No fracture or
dislocation.

## 2022-04-29 ENCOUNTER — Encounter (HOSPITAL_COMMUNITY): Payer: Self-pay | Admitting: *Deleted

## 2022-04-29 ENCOUNTER — Other Ambulatory Visit: Payer: Self-pay

## 2022-04-29 ENCOUNTER — Ambulatory Visit (HOSPITAL_COMMUNITY)
Admission: EM | Admit: 2022-04-29 | Discharge: 2022-04-29 | Disposition: A | Discharge status: Home / Self Care | Payer: No Typology Code available for payment source | Attending: Emergency Medicine | Admitting: Emergency Medicine

## 2022-04-29 DIAGNOSIS — R051 Acute cough: Secondary | ICD-10-CM | POA: Diagnosis not present

## 2022-04-29 DIAGNOSIS — R0981 Nasal congestion: Secondary | ICD-10-CM

## 2022-04-29 DIAGNOSIS — R062 Wheezing: Secondary | ICD-10-CM

## 2022-04-29 MED ORDER — IPRATROPIUM-ALBUTEROL 0.5-2.5 (3) MG/3ML IN SOLN
RESPIRATORY_TRACT | Status: AC
  Filled 2022-04-29: qty 3

## 2022-04-29 MED ORDER — IPRATROPIUM-ALBUTEROL 0.5-2.5 (3) MG/3ML IN SOLN
3.0000 mL | Freq: Once | RESPIRATORY_TRACT | Status: AC
  Administered 2022-04-29: 3 mL via RESPIRATORY_TRACT

## 2022-04-29 MED ORDER — BENZONATATE 100 MG PO CAPS: 100.0000 mg | ORAL_CAPSULE | Freq: Three times a day (TID) | ORAL | 0 refills | Status: DC | PRN

## 2022-04-29 MED ORDER — ALBUTEROL SULFATE HFA 108 (90 BASE) MCG/ACT IN AERS: 1.0000 | INHALATION_SPRAY | Freq: Four times a day (QID) | RESPIRATORY_TRACT | 0 refills | Status: DC | PRN

## 2022-04-29 NOTE — ED Provider Notes (Signed)
[facility]    CSN: [id number] Arrival date & time: 04/29/22  1356     History   Chief Complaint Chief Complaint  Patient presents with   Cough   Sore Throat   Nasal Congestion    HPI Allen Davis is a 41 y.o. male.  Presents with over 1 week history of nasal congestion, cough. Reports he has been coughing very hard at night, leading to sore throat.  Feels there is a lot of congestion in his chest, sometimes has productive cough but other times feels like he is unable to get anything out. Some trouble breathing with exertion. Denies any fever, chills, body aches, chest pain, shortness of breath at rest, abdominal pain, vomiting/diarrhea.  Reports similar occurrence a few years ago for which he needed a breathing treatment.  No history of asthma or COPD.  Non-smoker, never smoker Does not have primary care provider  History reviewed. No pertinent past medical history.  There are no problems to display for this patient.   Past Surgical History:  Procedure Laterality Date   FRACTURE SURGERY        Home Medications    Prior to Admission medications   Medication Sig Start Date End Date Taking? Authorizing Provider  albuterol (VENTOLIN HFA) 108 (90 Base) MCG/ACT inhaler Inhale 1-2 puffs into the lungs every 6 (six) hours as needed for wheezing or shortness of breath. 04/29/22  Yes Zhoey Blackstock, Lurena Joiner, PA-C  benzonatate (TESSALON) 100 MG capsule Take 1 capsule (100 mg total) by mouth 3 (three) times daily as needed for cough. 04/29/22  Yes Genice Kimberlin, Ray Church    Family History History reviewed. No pertinent family history.  Social History Social History   Tobacco Use   Smoking status: Never   Smokeless tobacco: Never  Substance Use Topics   Alcohol use: No   Drug use: No     Allergies   Patient has no known allergies.   Review of Systems Review of Systems  Respiratory:  Positive for cough.    Per HPI  Physical Exam Triage Vital Signs ED Triage  Vitals  Enc Vitals Group     BP 04/29/22 1419 131/80     Pulse Rate 04/29/22 1419 75     Resp 04/29/22 1419 18     Temp 04/29/22 1419 98.8 F (37.1 C)     Temp src --      SpO2 04/29/22 1419 95 %     Weight --      Height --      Head Circumference --      Peak Flow --      Pain Score 04/29/22 1418 0     Pain Loc --      Pain Edu? --      Excl. in GC? --    No data found.  Updated Vital Signs BP 131/80   Pulse 75   Temp 98.8 F (37.1 C)   Resp 18   SpO2 95%     Physical Exam Vitals and nursing note reviewed.  Constitutional:      General: He is not in acute distress.    Appearance: Normal appearance.  HENT:     Nose: No congestion.     Mouth/Throat:     Pharynx: Oropharynx is clear.  Eyes:     Conjunctiva/sclera: Conjunctivae normal.  Cardiovascular:     Rate and Rhythm: Normal rate and regular rhythm.     Heart sounds: Normal heart sounds.  Pulmonary:  Effort: Pulmonary effort is normal.     Breath sounds: Wheezing present.  Neurological:     Mental Status: He is alert and oriented to person, place, and time.     UC Treatments / Results  Labs (all labs ordered are listed, but only abnormal results are displayed) Labs Reviewed - No data to display  EKG   Radiology No results found.  Procedures Procedures   Medications Ordered in UC Medications  ipratropium-albuterol (DUONEB) 0.5-2.5 (3) MG/3ML nebulizer solution 3 mL (has no administration in time range)  ipratropium-albuterol (DUONEB) 0.5-2.5 (3) MG/3ML nebulizer solution 3 mL (3 mLs Nebulization Given 04/29/22 1457)    Initial Impression / Assessment and Plan / UC Course  I have reviewed the triage vital signs and the nursing notes.  Pertinent labs & imaging results that were available during my care of the patient were reviewed by me and considered in my medical decision making (see chart for details).  Patient is well-appearing, afebrile.  Occasional coughing fits in the room,  nonproductive.  Lung sounds with wheezing throughout.  DuoNeb given with much improvement in lung sounds, patient feels he is breathing better. Some coughing brought up a little mucous but he feels there is more in the chest that can come up. Requesting second duoneb. Given with improvement. Albuterol inhaler sent to pharmacy, with tessalon 3x daily as needed. Also recommend daily allergy medicine.  Set up with PCP, appointment for aug 16th. Strict return precautions, patient agrees to plan.  Final Clinical Impressions(s) / UC Diagnoses   Final diagnoses:  Acute cough  Nasal congestion     Discharge Instructions      Take the cough medicine 3 times daily as needed.  If this medicine makes you drowsy, please take only at bedtime.  Use the inhaler every 4-6 hours as needed.  Please follow-up with your new primary care provider regarding these symptoms.  Please go to the emergency department if symptoms worsen.     ED Prescriptions     Medication Sig Dispense Auth. Provider   benzonatate (TESSALON) 100 MG capsule Take 1 capsule (100 mg total) by mouth 3 (three) times daily as needed for cough. 21 capsule Marcel Gary, PA-C   albuterol (VENTOLIN HFA) 108 (90 Base) MCG/ACT inhaler Inhale 1-2 puffs into the lungs every 6 (six) hours as needed for wheezing or shortness of breath. 18 g Kierstan Auer, Lurena Joiner, PA-C      PDMP not reviewed this encounter.   Carrolyn Hilmes, Lurena Joiner, [place] 04/29/22 1607

## 2022-04-29 NOTE — ED Triage Notes (Signed)
Pt reports for past 11/2 he has had a cough with congestion and sore throat . Pt reports he has had a breathing treatment in the past for same Sx's.

## 2022-04-29 NOTE — Discharge Instructions (Addendum)
Take the cough medicine 3 times daily as needed.  If this medicine makes you drowsy, please take only at bedtime.  Use the inhaler every 4-6 hours as needed.  Please follow-up with your new primary care provider regarding these symptoms.  Please go to the emergency department if symptoms worsen.

## 2022-05-09 ENCOUNTER — Ambulatory Visit: Payer: No Typology Code available for payment source | Admitting: Family

## 2023-07-25 ENCOUNTER — Ambulatory Visit (HOSPITAL_COMMUNITY)
Admission: EM | Admit: 2023-07-25 | Discharge: 2023-07-25 | Disposition: A | Discharge status: Home / Self Care | Payer: No Typology Code available for payment source | Attending: Internal Medicine | Admitting: Internal Medicine

## 2023-07-25 ENCOUNTER — Encounter (HOSPITAL_COMMUNITY): Payer: Self-pay

## 2023-07-25 DIAGNOSIS — Z202 Contact with and (suspected) exposure to infections with a predominantly sexual mode of transmission: Secondary | ICD-10-CM | POA: Insufficient documentation

## 2023-07-25 DIAGNOSIS — R369 Urethral discharge, unspecified: Secondary | ICD-10-CM | POA: Insufficient documentation

## 2023-07-25 NOTE — ED Provider Notes (Signed)
[facility]    CSN: [id number] Arrival date & time: 07/25/23  4132      History   Chief Complaint Chief Complaint  Patient presents with   Exposure to STD    HPI Allen Davis is a 42 y.o. male.   Patient presents to urgent care for evaluation of penile discharge that started a few days ago.  Penile discharge is clear, without odor, and he denies dysuria, other urinary symptoms, fevers, chills, abdominal pain, N/V/D, and penile lesions.  Reports recent new male sexual partner that he states was protected but "the condom was too tight" and he wonders if the condom was ineffective.  He has had gonorrhea and chlamydia in the past and states this does not feel the same.  His recent new partner told him that he needs to get checked for STDs but did not tell him if she tested positive for any infections.   Exposure to STD    History reviewed. No pertinent past medical history.  There are no problems to display for this patient.   Past Surgical History:  Procedure Laterality Date   FRACTURE SURGERY         Home Medications    Prior to Admission medications   Medication Sig Start Date End Date Taking? Authorizing Provider  albuterol (VENTOLIN HFA) 108 (90 Base) MCG/ACT inhaler Inhale 1-2 puffs into the lungs every 6 (six) hours as needed for wheezing or shortness of breath. 04/29/22   Rising, Lurena Joiner, PA-C  benzonatate (TESSALON) 100 MG capsule Take 1 capsule (100 mg total) by mouth 3 (three) times daily as needed for cough. 04/29/22   Rising, Ray Church    Family History History reviewed. No pertinent family history.  Social History Social History   Tobacco Use   Smoking status: Never   Smokeless tobacco: Never  Vaping Use   Vaping status: Never Used  Substance Use Topics   Alcohol use: No   Drug use: No     Allergies   Patient has no known allergies.   Review of Systems Review of Systems Per HPI  Physical Exam Triage Vital Signs ED  Triage Vitals  Encounter Vitals Group     BP 07/25/23 0828 117/79     Systolic BP Percentile --      Diastolic BP Percentile --      Pulse Rate 07/25/23 0828 (!) 59     Resp 07/25/23 0828 18     Temp 07/25/23 0828 97.7 F (36.5 C)     Temp Source 07/25/23 0828 Oral     SpO2 07/25/23 0828 98 %     Weight --      Height --      Head Circumference --      Peak Flow --      Pain Score 07/25/23 0830 0     Pain Loc --      Pain Education --      Exclude from Growth Chart --    No data found.  Updated Vital Signs BP 117/79 (BP Location: Right Arm)   Pulse (!) 59   Temp 97.7 F (36.5 C) (Oral)   Resp 18   SpO2 98%   Visual Acuity Right Eye Distance:   Left Eye Distance:   Bilateral Distance:    Right Eye Near:   Left Eye Near:    Bilateral Near:     Physical Exam Vitals and nursing note reviewed.  Constitutional:      Appearance: He  is not ill-appearing or toxic-appearing.  HENT:     Head: Normocephalic and atraumatic.     Right Ear: Hearing and external ear normal.     Left Ear: Hearing and external ear normal.     Nose: Nose normal.     Mouth/Throat:     Lips: Pink.  Eyes:     General: Lids are normal. Vision grossly intact. Gaze aligned appropriately.     Extraocular Movements: Extraocular movements intact.     Conjunctiva/sclera: Conjunctivae normal.  Pulmonary:     Effort: Pulmonary effort is normal.  Musculoskeletal:     Cervical back: Neck supple.  Skin:    General: Skin is warm and dry.     Capillary Refill: Capillary refill takes less than 2 seconds.     Findings: No rash.  Neurological:     General: No focal deficit present.     Mental Status: He is alert and oriented to person, place, and time. Mental status is at baseline.     Cranial Nerves: No dysarthria or facial asymmetry.  Psychiatric:        Mood and Affect: Mood normal.        Speech: Speech normal.        Behavior: Behavior normal.        Thought Content: Thought content normal.         Judgment: Judgment normal.      UC Treatments / Results  Labs (all labs ordered are listed, but only abnormal results are displayed) Labs Reviewed  CYTOLOGY, (ORAL, ANAL, URETHRAL) ANCILLARY ONLY    EKG   Radiology No results found.  Procedures Procedures (including critical care time)  Medications Ordered in UC Medications - No data to display  Initial Impression / Assessment and Plan / UC Course  I have reviewed the triage vital signs and the nursing notes.  Pertinent labs & imaging results that were available during my care of the patient were reviewed by me and considered in my medical decision making (see chart for details).   1.  Possible exposure to STD, penile discharge STI labs pending, will notify patient of positive results and treat accordingly per protocol when labs result.  Patient declines HIV and syphilis testing today.   Patient to avoid sexual intercourse until screening testing comes back.   Education provided regarding safe sexual practices and patient encouraged to use protection to prevent spread of STIs.    Counseled patient on potential for adverse effects with medications prescribed/recommended today, strict ER and return-to-clinic precautions discussed, patient verbalized understanding.    Final Clinical Impressions(s) / UC Diagnoses   Final diagnoses:  Possible exposure to STD  Penile discharge     Discharge Instructions      STD testing pending, this will take 2-3 days to result. We will only call you if your testing is positive for any infection(s) and we will provide treatment.  Avoid sexual intercourse until your STD results come back.  If any of your STD results are positive, you will need to avoid sexual intercourse for 7 days while you are being treated to prevent spread of STD.  Condom use is the best way to prevent spread of STDs. Notify partner(s) of any positive results.  Return to urgent care as needed.     ED  Prescriptions   None    PDMP not reviewed this encounter.   Carlisle Beers, [place] 07/25/23 [phone]

## 2023-07-25 NOTE — ED Triage Notes (Signed)
Pt presents with discharge from private area x 2 days. Pt states "a male told me I should get tested for STD's , I was not having any symptoms at the time but now I am having discharge." Pt denies taking medications for symptoms.

## 2023-07-25 NOTE — Discharge Instructions (Signed)

## 2023-07-26 LAB — CYTOLOGY, (ORAL, ANAL, URETHRAL) ANCILLARY ONLY
Chlamydia: NEGATIVE
Comment: NEGATIVE
Comment: NEGATIVE
Comment: NORMAL
Neisseria Gonorrhea: NEGATIVE
Trichomonas: NEGATIVE

## 2023-07-30 ENCOUNTER — Ambulatory Visit (HOSPITAL_COMMUNITY)
Admission: RE | Admit: 2023-07-30 | Discharge: 2023-07-30 | Disposition: A | Discharge status: Home / Self Care | Payer: Self-pay | Source: Ambulatory Visit | Attending: Family Medicine | Admitting: Family Medicine

## 2023-07-30 ENCOUNTER — Encounter (HOSPITAL_COMMUNITY): Payer: Self-pay

## 2023-07-30 VITALS — BP 111/75 | HR 65 | Temp 98.4°F | Resp 18

## 2023-07-30 DIAGNOSIS — Z202 Contact with and (suspected) exposure to infections with a predominantly sexual mode of transmission: Secondary | ICD-10-CM | POA: Insufficient documentation

## 2023-07-30 MED ORDER — DOXYCYCLINE HYCLATE 100 MG PO CAPS: 100.0000 mg | ORAL_CAPSULE | Freq: Two times a day (BID) | ORAL | 0 refills | Status: AC

## 2023-07-30 MED ORDER — LIDOCAINE HCL (PF) 1 % IJ SOLN
INTRAMUSCULAR | Status: AC
  Filled 2023-07-30: qty 2

## 2023-07-30 MED ORDER — CEFTRIAXONE SODIUM 500 MG IJ SOLR
INTRAMUSCULAR | Status: AC
  Filled 2023-07-30: qty 500

## 2023-07-30 MED ORDER — CEFTRIAXONE SODIUM 500 MG IJ SOLR
500.0000 mg | INTRAMUSCULAR | Status: DC
  Administered 2023-07-30: 500 mg via INTRAMUSCULAR

## 2023-07-30 NOTE — Discharge Instructions (Addendum)
You were seen today for exposure to STD.  You were given a shot of rocephin, and sent an antibiotic to the pharmacy to treat gonorrhea and chlamydia.  Your swab will be resulted by tomorrow, and if anything else is positive you will be notified.  Avoid intercourse until you have completed all medications.

## 2023-07-30 NOTE — ED Triage Notes (Signed)
Pt presents with urinary frequency, burning with urination, "worse" discharge than when seen on Thursday 10/31, and itchiness. Pt states "I was exposed to a STD, not sure if I got tested too early but the symptoms are only getting worse. I did call the male I had intercourse with and she said she tested positive for chlamydia and gonorrhea." Pt denies taking any medications for symptoms.

## 2023-07-30 NOTE — ED Provider Notes (Signed)
[facility]    CSN: [id number] Arrival date & time: 07/30/23  1127      History   Chief Complaint Chief Complaint  Patient presents with   SEXUALLY TRANSMITTED DISEASE    HPI Allen Davis is a 42 y.o. male.   Patient is here for exposure to STD.  He was seen last week for std exposure, only clear discharge.  Swab was negative.  However, he is having worsening pain and discharge.  Discharge is now green, and having more painful urination.  He reached out to his partner and was told she was treated for both chlamydia and gonorrhea from the health department.        History reviewed. No pertinent past medical history.  There are no problems to display for this patient.   Past Surgical History:  Procedure Laterality Date   FRACTURE SURGERY         Home Medications    Prior to Admission medications   Medication Sig Start Date End Date Taking? Authorizing Provider  albuterol (VENTOLIN HFA) 108 (90 Base) MCG/ACT inhaler Inhale 1-2 puffs into the lungs every 6 (six) hours as needed for wheezing or shortness of breath. 04/29/22   Rising, Lurena Joiner, PA-C  benzonatate (TESSALON) 100 MG capsule Take 1 capsule (100 mg total) by mouth 3 (three) times daily as needed for cough. 04/29/22   Rising, Ray Church    Family History History reviewed. No pertinent family history.  Social History Social History   Tobacco Use   Smoking status: Never   Smokeless tobacco: Never  Vaping Use   Vaping status: Never Used  Substance Use Topics   Alcohol use: No   Drug use: No     Allergies   Patient has no known allergies.   Review of Systems Review of Systems  Constitutional: Negative.   HENT: Negative.    Respiratory: Negative.    Cardiovascular: Negative.   Gastrointestinal: Negative.   Genitourinary:  Positive for dysuria and penile discharge.     Physical Exam Triage Vital Signs ED Triage Vitals  Encounter Vitals Group     BP 07/30/23 1158 111/75      Systolic BP Percentile --      Diastolic BP Percentile --      Pulse Rate 07/30/23 1158 65     Resp 07/30/23 1158 18     Temp 07/30/23 1158 98.4 F (36.9 C)     Temp src --      SpO2 07/30/23 1158 95 %     Weight --      Height --      Head Circumference --      Peak Flow --      Pain Score 07/30/23 1200 10     Pain Loc --      Pain Education --      Exclude from Growth Chart --    No data found.  Updated Vital Signs BP 111/75 (BP Location: Right Arm)   Pulse 65   Temp 98.4 F (36.9 C)   Resp 18   SpO2 95%   Visual Acuity Right Eye Distance:   Left Eye Distance:   Bilateral Distance:    Right Eye Near:   Left Eye Near:    Bilateral Near:     Physical Exam Constitutional:      Appearance: Normal appearance.  Cardiovascular:     Rate and Rhythm: Normal rate and regular rhythm.  Pulmonary:     Effort: Pulmonary effort  is normal.     Breath sounds: Normal breath sounds.  Neurological:     General: No focal deficit present.     Mental Status: He is alert.  Psychiatric:        Mood and Affect: Mood normal.      UC Treatments / Results  Labs (all labs ordered are listed, but only abnormal results are displayed) Labs Reviewed  CYTOLOGY, (ORAL, ANAL, URETHRAL) ANCILLARY ONLY    EKG   Radiology No results found.  Procedures Procedures (including critical care time)  Medications Ordered in UC Medications  cefTRIAXone (ROCEPHIN) injection 500 mg (has no administration in time range)    Initial Impression / Assessment and Plan / UC Course  I have reviewed the triage vital signs and the nursing notes.  Pertinent labs & imaging results that were available during my care of the patient were reviewed by me and considered in my medical decision making (see chart for details).   Final Clinical Impressions(s) / UC Diagnoses   Final diagnoses:  STD exposure     Discharge Instructions      You were seen today for exposure to STD.  You were given  a shot of rocephin, and sent an antibiotic to the pharmacy to treat gonorrhea and chlamydia.  Your swab will be resulted by tomorrow, and if anything else is positive you will be notified.  Avoid intercourse until you have completed all medications.     ED Prescriptions     Medication Sig Dispense Auth. Provider   doxycycline (VIBRAMYCIN) 100 MG capsule Take 1 capsule (100 mg total) by mouth 2 (two) times daily for 7 days. 14 capsule Jannifer Franklin, MD      PDMP not reviewed this encounter.   Jannifer Franklin, MD 07/30/23 [phone]

## 2023-07-31 LAB — CYTOLOGY, (ORAL, ANAL, URETHRAL) ANCILLARY ONLY
Chlamydia: NEGATIVE
Comment: NEGATIVE
Comment: NEGATIVE
Comment: NORMAL
Neisseria Gonorrhea: NEGATIVE
Trichomonas: NEGATIVE

## 2024-07-15 ENCOUNTER — Encounter (HOSPITAL_COMMUNITY): Payer: Self-pay

## 2024-07-15 ENCOUNTER — Ambulatory Visit (HOSPITAL_COMMUNITY)
Admission: EM | Admit: 2024-07-15 | Discharge: 2024-07-15 | Disposition: A | Discharge status: Home / Self Care | Payer: Self-pay | Attending: Family Medicine | Admitting: Family Medicine

## 2024-07-15 DIAGNOSIS — R369 Urethral discharge, unspecified: Secondary | ICD-10-CM | POA: Insufficient documentation

## 2024-07-15 DIAGNOSIS — Z202 Contact with and (suspected) exposure to infections with a predominantly sexual mode of transmission: Secondary | ICD-10-CM | POA: Insufficient documentation

## 2024-07-15 LAB — HIV ANTIBODY (ROUTINE TESTING W REFLEX): HIV Screen 4th Generation wRfx: NONREACTIVE

## 2024-07-15 MED ORDER — CEFTRIAXONE SODIUM 500 MG IJ SOLR
INTRAMUSCULAR | Status: AC
  Filled 2024-07-15: qty 500

## 2024-07-15 MED ORDER — CEFTRIAXONE SODIUM 500 MG IJ SOLR
500.0000 mg | Freq: Once | INTRAMUSCULAR | Status: AC
  Administered 2024-07-15: 500 mg via INTRAMUSCULAR

## 2024-07-15 MED ORDER — LIDOCAINE HCL (PF) 1 % IJ SOLN
INTRAMUSCULAR | Status: AC
  Filled 2024-07-15: qty 2

## 2024-07-15 MED ORDER — DOXYCYCLINE HYCLATE 100 MG PO CAPS: 100.0000 mg | ORAL_CAPSULE | Freq: Two times a day (BID) | ORAL | 0 refills | Status: DC

## 2024-07-15 NOTE — Medical Student Note (Signed)
 [facility] Insurance account manager Note For educational purposes for Medical, PA and NP students only and not part of the legal medical record.   CSN: [id number] Arrival date & time: 07/15/24  0801      History   Chief Complaint Chief Complaint  Patient presents with   Exposure to STD    HPI Allen Davis is a 43 y.o. male.  Pt with a non-contributory medical history presents with a dark white penile discharge for 2 days. He has associated dysuria, urinary frequency and urgency. He denies any fevers, chills, abd pain, or genital lesions. He reports that his sexual partner notified him she was positive for gonorrhea and chlamydia.   The history is provided by the patient.  Exposure to STD Pertinent negatives include no abdominal pain.    History reviewed. No pertinent past medical history.  There are no active problems to display for this patient.   Past Surgical History:  Procedure Laterality Date   FRACTURE SURGERY         Home Medications    Prior to Admission medications   Not on File    Family History History reviewed. No pertinent family history.  Social History Social History   Tobacco Use   Smoking status: Never   Smokeless tobacco: Never  Vaping Use   Vaping status: Never Used  Substance Use Topics   Alcohol use: No   Drug use: No     Allergies   Patient has no known allergies.   Review of Systems Review of Systems  Constitutional:  Negative for chills and fever.  HENT:  Negative for sore throat.   Gastrointestinal:  Negative for abdominal pain.  Genitourinary:  Positive for dysuria, frequency, penile discharge and urgency. Negative for flank pain, genital sores, hematuria and testicular pain.  Musculoskeletal:  Negative for back pain.     Physical Exam Updated Vital Signs BP 136/81 (BP Location: Right Arm)   Pulse 60   Temp 97.7 F (36.5 C) (Oral)   Resp 18   SpO2 98%   Physical Exam Constitutional:       Appearance: Normal appearance.  Cardiovascular:     Rate and Rhythm: Normal rate and regular rhythm.     Pulses: Normal pulses.     Heart sounds: Normal heart sounds.  Pulmonary:     Effort: Pulmonary effort is normal.     Breath sounds: Normal breath sounds.  Abdominal:     General: Bowel sounds are normal.     Palpations: Abdomen is soft.     Tenderness: There is no abdominal tenderness. There is no right CVA tenderness or left CVA tenderness.  Genitourinary:    Comments: deferred Neurological:     Mental Status: He is alert.      ED Treatments / Results  Labs (all labs ordered are listed, but only abnormal results are displayed) Labs Reviewed - No data to display  EKG  Radiology No results found.  Procedures Procedures (including critical care time)  Medications Ordered in ED Medications - No data to display   Initial Impression / Assessment and Plan / ED Course  I have reviewed the triage vital signs and the nursing notes.  Pertinent labs & imaging results that were available during my care of the patient were reviewed by me and considered in my medical decision making (see chart for details).     Urethral cytology, HIV, and RPR pending. Will update plan of care as needed.  Will treat empirically  for presumptive GC/Chlamydia infection. 500 mg Rocephin  IM and doxycycline  100 mg BID for 7 days. Counseled patient on STI prevention and importance of having all partners treated prior to resuming intercourse.   Final Clinical Impressions(s) / ED Diagnoses   Final diagnoses:  None    New Prescriptions New Prescriptions   No medications on file

## 2024-07-15 NOTE — ED Provider Notes (Signed)
 [facility]   [id number] 07/15/24 Arrival Time: 0801  ASSESSMENT & PLAN:  1. Penile discharge   2. STD exposure    HPI Allen Davis is a 43 y.o. male.   Pt with a non-contributory medical history presents with a dark white penile discharge for 2 days. He has associated dysuria, urinary frequency and urgency. He denies any fevers, chills, abd pain, or genital lesions. He reports that his sexual partner notified him she was positive for gonorrhea and chlamydia.   The history is provided by the patient.  Exposure to STD Pertinent negatives include no abdominal pain.     History reviewed. No pertinent past medical history.       There are no active problems to display for this patient.          Past Surgical History:  Procedure Laterality Date   FRACTURE SURGERY                    Home Medications                Prior to Admission medications   Not on File      Family History History reviewed. No pertinent family history.       Social History Social History  Social History        Tobacco Use   Smoking status: Never   Smokeless tobacco: Never  Vaping Use   Vaping status: Never Used  Substance Use Topics   Alcohol use: No   Drug use: No          Allergies              Patient has no known allergies.     Review of Systems Review of Systems  Constitutional:  Negative for chills and fever.  HENT:  Negative for sore throat.   Gastrointestinal:  Negative for abdominal pain.  Genitourinary:  Positive for dysuria, frequency, penile discharge and urgency. Negative for flank pain, genital sores, hematuria and testicular pain.  Musculoskeletal:  Negative for back pain.       Physical Exam Updated Vital Signs BP 136/81 (BP Location: Right Arm)   Pulse 60   Temp 97.7 F (36.5 C) (Oral)   Resp 18   SpO2 98%    Physical Exam Constitutional:      Appearance: Normal appearance.  Cardiovascular:     Rate and Rhythm: Normal rate  and regular rhythm.     Pulses: Normal pulses.     Heart sounds: Normal heart sounds.  Pulmonary:     Effort: Pulmonary effort is normal.     Breath sounds: Normal breath sounds.  Abdominal:     General: Bowel sounds are normal.     Palpations: Abdomen is soft.     Tenderness: There is no abdominal tenderness. There is no right CVA tenderness or left CVA tenderness.  Genitourinary:    Comments: deferred Neurological:     Mental Status: He is alert.          ED Treatments / Results  Labs (all labs ordered are listed, but only abnormal results are displayed) Labs Reviewed - No data to display   EKG   Radiology Imaging Results (Last 48 hours)  No results found.     Procedures Procedures (including critical care time)   Medications Ordered in ED Medications - No data to display     Initial Impression / Assessment and Plan / ED Course  I have reviewed the triage  vital signs and the nursing notes.   Pertinent labs & imaging results that were available during my care of the patient were reviewed by me and considered in my medical decision making (see chart for details).     Urethral cytology, HIV, and RPR pending. Will update plan of care as needed.   Will treat empirically for presumptive GC/Chlamydia infection. 500 mg Rocephin  IM and doxycycline  100 mg BID for 7 days. Counseled patient on STI prevention and importance of having all partners treated prior to resuming intercourse.    Final Clinical Impressions(s) / ED Diagnoses    Final diagnoses:  1. Penile discharge   2. STD exposure           Discharge Instructions      You have been given the following today for treatment of suspected gonorrhea and/or chlamydia:  cefTRIAXone  (ROCEPHIN ) injection 500 mg  Please pick up your prescription for doxycycline  100 mg and begin taking twice daily for the next seven (7) days.  Even though we have treated you today, we have sent testing for sexually transmitted  infections. We will notify you of any positive results once they are received. If required, we will prescribe any medications you might need.  Please refrain from all sexual activity for at least the next seven days.      Meds ordered this encounter  Medications   cefTRIAXone  (ROCEPHIN ) injection 500 mg    Antibiotic Indication::   STD   doxycycline  (VIBRAMYCIN ) 100 MG capsule    Sig: Take 1 capsule (100 mg total) by mouth 2 (two) times daily.    Dispense:  14 capsule    Refill:  0   Labs Reviewed  HIV ANTIBODY (ROUTINE TESTING W REFLEX)  RPR  CYTOLOGY, (ORAL, ANAL, URETHRAL) ANCILLARY COOPER Rolinda Rogue, MD 07/15/24 [phone]

## 2024-07-15 NOTE — ED Triage Notes (Signed)
 Pt c/o penile discharge and burning on urination x4 days. States was told he has been exposed to chlamydia and gonorrhea.

## 2024-07-15 NOTE — Discharge Instructions (Addendum)

## 2024-07-16 LAB — CYTOLOGY, (ORAL, ANAL, URETHRAL) ANCILLARY ONLY
Chlamydia: NEGATIVE
Comment: NEGATIVE
Comment: NEGATIVE
Comment: NORMAL
Neisseria Gonorrhea: NEGATIVE
Trichomonas: NEGATIVE

## 2024-07-16 LAB — RPR: RPR Ser Ql: NONREACTIVE

## 2024-07-21 ENCOUNTER — Ambulatory Visit (HOSPITAL_COMMUNITY)
Admission: EM | Admit: 2024-07-21 | Discharge: 2024-07-21 | Disposition: A | Discharge status: Home / Self Care | Payer: Self-pay | Attending: Emergency Medicine | Admitting: Emergency Medicine

## 2024-07-21 ENCOUNTER — Encounter (HOSPITAL_COMMUNITY): Payer: Self-pay

## 2024-07-21 DIAGNOSIS — N5089 Other specified disorders of the male genital organs: Secondary | ICD-10-CM | POA: Insufficient documentation

## 2024-07-21 MED ORDER — VALACYCLOVIR HCL 1 G PO TABS: 1000.0000 mg | ORAL_TABLET | Freq: Every day | ORAL | 0 refills | Status: AC

## 2024-07-21 MED ORDER — LIDOCAINE 5 % EX OINT: 1.0000 | TOPICAL_OINTMENT | Freq: Four times a day (QID) | CUTANEOUS | 0 refills | Status: AC | PRN

## 2024-07-21 MED ORDER — VALACYCLOVIR HCL 1 G PO TABS: 1000.0000 mg | ORAL_TABLET | Freq: Two times a day (BID) | ORAL | 0 refills | Status: AC

## 2024-07-21 NOTE — Discharge Instructions (Addendum)
 Because the lesions we evaluated during your visit today are concerning for herpes infection, I recommend that you begin valacyclovir 1 gram twice daily.  Early treatment is recommended to decrease severity and duration of symptoms and to reduce your ability to give it to the people.  I have sent a prescription for valacyclovir to your pharmacy, please take for a full 10 days, even if you are feeling better.      The result of the herpes culture which was obtained during your visit today will be posted to your MyChart account within the next 3 to 5 days. Please abstain from sexual intercourse of any kind, vaginal, oral or anal, until you have received the results of your herpes culture.    For comfort while you are waiting for the valacyclovir to take full effect, I provided you with a topical lidocaine  ointment which will numb the area.  You can apply this as often as needed.    If your herpes test is positive, please finish the 10-day course of valacyclovir twice daily and then begin taking valacyclovir 1 g once daily, every day, for suppressive therapy.  You can continue taking valacyclovir 1 g daily for as long as you wish to suppress the virus.  Keep in mind that while daily suppressive therapy may not completely eliminate every single outbreak, it will make your outbreaks much less frequent, milder in severity and the duration of the outbreaks will be much shorter.  There is also now good clinical research now showing that taking valacyclovir every day for several years can suppress the virus completely.  To obtain future refills of this medication, please follow-up with primary care.  At your age, it is important that you begin annual wellness visits for preventative care.  On the last page of this visit summary, there is a QR code that you can use to reach our appointment scheduling website.  You can find a primary care provider based on proximity to where you live or based on finding the first  available, the decision is yours.  Please be sure that you schedule an appointment within 90 days to make sure you do not run out of valacyclovir.  Thank you for visiting [facility] today.  We appreciate the opportunity to participate in your care.

## 2024-07-21 NOTE — ED Triage Notes (Signed)
 Pt c/o painful bumps with drainage to head of penis x2 days.

## 2024-07-21 NOTE — ED Provider Notes (Signed)
 [facility]    CSN: [id number] Arrival date & time: 07/21/24  9195    HISTORY   Chief Complaint  Patient presents with   Rash   HPI Allen Davis is a pleasant, 43 y.o. male who presents to urgent care today. Patient complains of painful bumps on the tip of his penis that are very painful, draining clear fluid for the past 2 days.  Patient states that about 4 days ago, he began to have some tingling and discomfort in the same area.  States blisters appeared 2 days after the discomfort started and have since deroofed.  Denies known history of genital HSV, states he has never had similar times in the past.  Patient reports last sexual contact was a little over a week ago, states that person advised them that they tested positive for an STD so he came to this clinic for STD testing, was treated empirically for gonorrhea and chlamydia, per EMR all results were negative.  Patient denies penile drainage, burning with urination, testicular pain or swelling, scrotal pain or swelling, fever, body aches, chills, fatigue, malaise.  The history is provided by the patient.  Rash   History reviewed. No pertinent past medical history. There are no active problems to display for this patient.  Past Surgical History:  Procedure Laterality Date   FRACTURE SURGERY      Home Medications    Prior to Admission medications   Medication Sig Start Date End Date Taking? Authorizing Provider  doxycycline  (VIBRAMYCIN ) 100 MG capsule Take 1 capsule (100 mg total) by mouth 2 (two) times daily. 07/15/24   Rolinda Rogue, MD    Family History History reviewed. No pertinent family history. Social History Social History   Tobacco Use   Smoking status: Never   Smokeless tobacco: Never  Vaping Use   Vaping status: Never Used  Substance Use Topics   Alcohol use: No   Drug use: No   Allergies   Patient has no known allergies.  Review of Systems Review of Systems  Skin:  Positive for  rash.   Pertinent findings revealed after performing a 14 point review of systems has been noted in the history of present illness.  Physical Exam Vital Signs BP (!) 148/79 (BP Location: Left Arm)   Pulse 71   Temp 97.9 F (36.6 C) (Oral)   Resp 18   SpO2 98%   No data found.  Physical Exam Vitals and nursing note reviewed.  Constitutional:      General: He is awake. He is not in acute distress.    Appearance: Normal appearance. He is not ill-appearing.  HENT:     Head: Normocephalic and atraumatic.  Eyes:     General: Lids are normal.        Right eye: No discharge.        Left eye: No discharge.     Conjunctiva/sclera: Conjunctivae normal.     Right eye: Right conjunctiva is not injected.     Left eye: Left conjunctiva is not injected.  Neck:     Trachea: Trachea and phonation normal.  Cardiovascular:     Rate and Rhythm: Normal rate and regular rhythm.  Pulmonary:     Effort: Pulmonary effort is normal.  Genitourinary:    Penis: Circumcised. Lesions present.        Comments: Pt politely declines GU exam, pt did provide a penile swab for testing.   Musculoskeletal:     Cervical back: Full passive range of  motion without pain, normal range of motion and neck supple.  Lymphadenopathy:     Cervical: No cervical adenopathy.  Skin:    General: Skin is warm and dry.     Findings: No erythema or rash.  Neurological:     General: No focal deficit present.     Mental Status: He is alert and oriented to person, place, and time. Mental status is at baseline.  Psychiatric:        Attention and Perception: Attention and perception normal.        Mood and Affect: Mood normal.        Speech: Speech normal.        Behavior: Behavior normal. Behavior is cooperative.        Thought Content: Thought content normal.     Visual Acuity Right Eye Distance:   Left Eye Distance:   Bilateral Distance:    Right Eye Near:   Left Eye Near:    Bilateral Near:     UC Couse /  Diagnostics / Procedures:     Radiology No results found.  Procedures Procedures (including critical care time) EKG  Pending results:  Labs Reviewed  HSV 1/2 PCR (SURFACE)    Medications Ordered in UC: Medications - No data to display  UC Diagnoses / Final Clinical Impressions(s)   I have reviewed the triage vital signs and the nursing notes.  Pertinent labs & imaging results that were available during my care of the patient were reviewed by me and considered in my medical decision making (see chart for details).    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 HSV testing was performed, patient advised that the results be posted to their MyChart and if any of the results are positive, they will be notified by phone, further treatment will be provided as indicated based on results of STD screening. Patient was advised to abstain from sexual intercourse until that they receive the results of their STD testing.  Patient was also advised to use condoms to protect themselves from STD exposure. Return precautions advised.  Drug allergies reviewed, all questions addressed.   Please see discharge instructions below for details of plan of care as provided to patient. ED Prescriptions     Medication Sig Dispense Auth. Provider   valACYclovir (VALTREX) 1000 MG tablet Take 1 tablet (1,000 mg total) by mouth daily. 90 tablet Joesph Shaver Scales, PA-C   valACYclovir (VALTREX) 1000 MG tablet Take 1 tablet (1,000 mg total) by mouth 2 (two) times daily for 10 days. 20 tablet Joesph Shaver Scales, PA-C   lidocaine  (XYLOCAINE ) 5 % ointment Apply 1 Application topically 4 (four) times daily as needed. 35.44 g Joesph Shaver Scales, PA-C      PDMP not reviewed this encounter.  Disposition Upon Discharge:  Condition: stable for discharge  home  Patient presented with concern for an acute illness with associated systemic symptoms and significant discomfort requiring urgent management. In my opinion, this is a condition that a prudent lay person (someone who possesses an average knowledge of health and medicine) may potentially expect to result in complications if not addressed urgently such as respiratory distress, impairment of bodily function or dysfunction of bodily organs.   As such, the patient has been evaluated and assessed, work-up was performed and treatment was provided in alignment with urgent care protocols and  evidence based medicine.  Patient/parent/caregiver has been advised that the patient may require follow up for further testing and/or treatment if the symptoms continue in spite of treatment, as clinically indicated and appropriate.  Routine symptom specific, illness specific and/or disease specific instructions were discussed with the patient and/or caregiver at length.  Prevention strategies for avoiding STD exposure were also discussed.  The patient will follow up with their current PCP if and as advised. If the patient does not currently have a PCP we will assist them in obtaining one.   The patient may need specialty follow up if the symptoms continue, in spite of conservative treatment and management, for further workup, evaluation, consultation and treatment as clinically indicated and appropriate.  Patient/parent/caregiver verbalized understanding and agreement of plan as discussed.  All questions were addressed during visit.  Please see discharge instructions below for further details of plan.    Discharge Instructions      Because the lesions we evaluated during your visit today are concerning for herpes infection, I recommend that you begin valacyclovir 1 gram twice daily.  Early treatment is recommended to decrease severity and duration of symptoms and to reduce your ability to give it to the people.   I have sent a prescription for valacyclovir to your pharmacy, please take for a full 10 days, even if you are feeling better.      The result of the herpes culture which was obtained during your visit today will be posted to your MyChart account within the next 3 to 5 days. Please abstain from sexual intercourse of any kind, vaginal, oral or anal, until you have received the results of your herpes culture.    For comfort while you are waiting for the valacyclovir to take full effect, I provided you with a topical lidocaine  ointment which will numb the area.  You can apply this as often as needed.    If your herpes test is positive, please finish the 10-day course of valacyclovir twice daily and then begin taking valacyclovir 1 g once daily, every day, for suppressive therapy.  You can continue taking valacyclovir 1 g daily for as long as you wish to suppress the virus.  Keep in mind that while daily suppressive therapy may not completely eliminate every single outbreak, it will make your outbreaks much less frequent, milder in severity and the duration of the outbreaks will be much shorter.  There is also now good clinical research now showing that taking valacyclovir every day for several years can suppress the virus completely.  To obtain future refills of this medication, please follow-up with primary care.  At your age, it is important that you begin annual wellness visits for preventative care.  On the last page of this visit summary, there is a QR code that you can use to reach our appointment scheduling website.  You can find a primary care provider based on proximity to where you live or based on finding the first available, the decision is yours.  Please be sure that you schedule an appointment within 90 days to make sure you do not run out of valacyclovir.  Thank you for visiting [facility] today.  We appreciate the opportunity to participate in your care.             This  office note has been dictated using Teaching laboratory technician.  Unfortunately, this method of dictation can sometimes lead to typographical or grammatical errors.  I apologize for  your inconvenience in advance if this occurs.  Please do not hesitate to reach out to me if clarification is needed.       Joesph Shaver [place], [place] 07/21/24 [phone]

## 2024-07-22 ENCOUNTER — Ambulatory Visit: Payer: Self-pay | Admitting: Emergency Medicine

## 2024-07-22 LAB — HSV 1/2 PCR (SURFACE)
HSV-1 DNA: NOT DETECTED
HSV-2 DNA: NOT DETECTED

## 2024-07-22 MED ORDER — CLOTRIMAZOLE 1 % EX CREA: TOPICAL_CREAM | CUTANEOUS | 1 refills | Status: AC

## 2024-07-22 NOTE — Progress Notes (Signed)
 Patient contacted by provider to advise him of negative HSV test.  Patient advised to discontinue valacyclovir at this time.  Patient advised symptoms may possibly be related to Candida balanitis.  Antifungal cream prescription sent to pharmacy.  Return precautions advised.
